# Patient Record
Sex: Female | Born: 1935 | Race: Black or African American | Hispanic: No | Marital: Single | Smoking: Former smoker
Health system: Southern US, Community
[De-identification: ages and names within clinical notes are randomized; demographics above are authoritative.]

## PROBLEM LIST (undated history)

## (undated) DIAGNOSIS — I10 Essential (primary) hypertension: Secondary | ICD-10-CM

## (undated) DIAGNOSIS — E785 Hyperlipidemia, unspecified: Secondary | ICD-10-CM

## (undated) DIAGNOSIS — B029 Zoster without complications: Secondary | ICD-10-CM

## (undated) DIAGNOSIS — R011 Cardiac murmur, unspecified: Secondary | ICD-10-CM

## (undated) HISTORY — DX: Hyperlipidemia, unspecified: E78.5

## (undated) HISTORY — DX: Essential (primary) hypertension: I10

## (undated) HISTORY — PX: BREAST BIOPSY: SHX20

## (undated) HISTORY — PX: TONSILLECTOMY: SUR1361

---

## 1978-03-07 HISTORY — PX: ABDOMINAL HYSTERECTOMY: SHX81

## 2007-08-03 ENCOUNTER — Ambulatory Visit: Payer: Self-pay | Admitting: Internal Medicine

## 2009-01-22 ENCOUNTER — Ambulatory Visit: Payer: Self-pay | Admitting: Family Medicine

## 2010-02-15 ENCOUNTER — Ambulatory Visit: Payer: Self-pay | Admitting: Family Medicine

## 2011-03-16 ENCOUNTER — Ambulatory Visit: Payer: Self-pay | Admitting: Family Medicine

## 2012-04-11 ENCOUNTER — Ambulatory Visit: Payer: Self-pay | Admitting: Internal Medicine

## 2012-06-27 ENCOUNTER — Ambulatory Visit: Payer: Self-pay | Admitting: Internal Medicine

## 2013-05-01 ENCOUNTER — Ambulatory Visit: Payer: Self-pay | Admitting: Internal Medicine

## 2014-05-07 ENCOUNTER — Ambulatory Visit: Payer: Self-pay | Admitting: Internal Medicine

## 2014-05-07 DIAGNOSIS — R928 Other abnormal and inconclusive findings on diagnostic imaging of breast: Secondary | ICD-10-CM | POA: Diagnosis not present

## 2014-05-07 DIAGNOSIS — Z1231 Encounter for screening mammogram for malignant neoplasm of breast: Secondary | ICD-10-CM | POA: Diagnosis not present

## 2014-05-13 ENCOUNTER — Ambulatory Visit: Payer: Self-pay | Admitting: Internal Medicine

## 2014-05-13 DIAGNOSIS — N6489 Other specified disorders of breast: Secondary | ICD-10-CM | POA: Diagnosis not present

## 2014-05-13 DIAGNOSIS — R928 Other abnormal and inconclusive findings on diagnostic imaging of breast: Secondary | ICD-10-CM | POA: Diagnosis not present

## 2014-05-13 LAB — HM MAMMOGRAPHY: HM Mammogram: NORMAL

## 2014-07-17 DIAGNOSIS — Z23 Encounter for immunization: Secondary | ICD-10-CM | POA: Diagnosis not present

## 2014-07-17 DIAGNOSIS — Z0000 Encounter for general adult medical examination without abnormal findings: Secondary | ICD-10-CM | POA: Diagnosis not present

## 2014-07-17 DIAGNOSIS — E782 Mixed hyperlipidemia: Secondary | ICD-10-CM | POA: Diagnosis not present

## 2014-07-17 DIAGNOSIS — I10 Essential (primary) hypertension: Secondary | ICD-10-CM | POA: Diagnosis not present

## 2014-07-17 LAB — LIPID PANEL
Cholesterol: 156 mg/dL (ref 0–200)
HDL: 66 mg/dL (ref 35–70)
LDL Cholesterol: 72 mg/dL
Triglycerides: 89 mg/dL (ref 40–160)

## 2014-07-17 LAB — HEMOGLOBIN A1C: HEMOGLOBIN A1C: 6.7 % — AB (ref 4.0–6.0)

## 2014-08-29 ENCOUNTER — Encounter: Payer: Self-pay | Admitting: Internal Medicine

## 2014-08-29 ENCOUNTER — Other Ambulatory Visit: Payer: Self-pay | Admitting: Internal Medicine

## 2014-08-29 ENCOUNTER — Other Ambulatory Visit: Payer: Self-pay

## 2014-08-29 DIAGNOSIS — E119 Type 2 diabetes mellitus without complications: Secondary | ICD-10-CM | POA: Insufficient documentation

## 2014-08-29 DIAGNOSIS — I10 Essential (primary) hypertension: Secondary | ICD-10-CM | POA: Insufficient documentation

## 2014-08-29 DIAGNOSIS — E782 Mixed hyperlipidemia: Secondary | ICD-10-CM | POA: Insufficient documentation

## 2014-08-29 DIAGNOSIS — M179 Osteoarthritis of knee, unspecified: Secondary | ICD-10-CM | POA: Insufficient documentation

## 2014-08-29 DIAGNOSIS — M1710 Unilateral primary osteoarthritis, unspecified knee: Secondary | ICD-10-CM | POA: Insufficient documentation

## 2014-08-29 MED ORDER — RAMIPRIL 2.5 MG PO CAPS: 2.5000 mg | ORAL_CAPSULE | Freq: Every day | ORAL | Status: DC

## 2014-11-28 ENCOUNTER — Other Ambulatory Visit: Payer: Self-pay | Admitting: Internal Medicine

## 2014-12-12 ENCOUNTER — Other Ambulatory Visit: Payer: Self-pay

## 2014-12-12 MED ORDER — ACCU-CHEK MULTICLIX LANCETS MISC: Status: DC

## 2014-12-12 MED ORDER — GLUCOSE BLOOD VI STRP: 1.0000 | ORAL_STRIP | Freq: Every day | Status: AC

## 2014-12-16 ENCOUNTER — Other Ambulatory Visit: Payer: Self-pay | Admitting: Internal Medicine

## 2014-12-16 MED ORDER — ACCU-CHEK SOFTCLIX LANCETS MISC: Status: AC

## 2015-04-01 ENCOUNTER — Other Ambulatory Visit: Payer: Self-pay

## 2015-04-01 MED ORDER — RAMIPRIL 2.5 MG PO CAPS: 2.5000 mg | ORAL_CAPSULE | Freq: Every day | ORAL | Status: DC

## 2015-04-01 MED ORDER — SIMVASTATIN 20 MG PO TABS: 20.0000 mg | ORAL_TABLET | Freq: Every day | ORAL | Status: DC

## 2015-04-01 MED ORDER — HYDROCHLOROTHIAZIDE 25 MG PO TABS: 25.0000 mg | ORAL_TABLET | Freq: Every day | ORAL | Status: DC

## 2015-04-01 MED ORDER — AMLODIPINE BESYLATE 5 MG PO TABS: 5.0000 mg | ORAL_TABLET | Freq: Every day | ORAL | Status: DC

## 2015-06-26 ENCOUNTER — Other Ambulatory Visit: Payer: Self-pay | Admitting: Internal Medicine

## 2015-06-28 ENCOUNTER — Other Ambulatory Visit: Payer: Self-pay | Admitting: Internal Medicine

## 2015-06-29 ENCOUNTER — Ambulatory Visit: Payer: Self-pay | Admitting: Internal Medicine

## 2015-07-06 DIAGNOSIS — B029 Zoster without complications: Secondary | ICD-10-CM

## 2015-07-06 HISTORY — DX: Zoster without complications: B02.9

## 2015-07-10 ENCOUNTER — Encounter: Payer: Self-pay | Admitting: Internal Medicine

## 2015-07-10 ENCOUNTER — Ambulatory Visit (INDEPENDENT_AMBULATORY_CARE_PROVIDER_SITE_OTHER): Payer: Medicare Other | Admitting: Internal Medicine

## 2015-07-10 VITALS — BP 142/60 | HR 60 | Temp 98.1°F | Resp 16 | Ht 71.0 in | Wt 141.5 lb

## 2015-07-10 DIAGNOSIS — J3089 Other allergic rhinitis: Secondary | ICD-10-CM | POA: Insufficient documentation

## 2015-07-10 DIAGNOSIS — E119 Type 2 diabetes mellitus without complications: Secondary | ICD-10-CM | POA: Diagnosis not present

## 2015-07-10 DIAGNOSIS — I10 Essential (primary) hypertension: Secondary | ICD-10-CM

## 2015-07-10 DIAGNOSIS — M62830 Muscle spasm of back: Secondary | ICD-10-CM

## 2015-07-10 DIAGNOSIS — H6120 Impacted cerumen, unspecified ear: Secondary | ICD-10-CM | POA: Insufficient documentation

## 2015-07-10 DIAGNOSIS — H6123 Impacted cerumen, bilateral: Secondary | ICD-10-CM | POA: Diagnosis not present

## 2015-07-10 MED ORDER — AMLODIPINE BESYLATE 5 MG PO TABS: 5.0000 mg | ORAL_TABLET | Freq: Every day | ORAL | Status: DC

## 2015-07-10 MED ORDER — SIMVASTATIN 20 MG PO TABS: 20.0000 mg | ORAL_TABLET | Freq: Every day | ORAL | Status: DC

## 2015-07-10 MED ORDER — RAMIPRIL 2.5 MG PO CAPS: 2.5000 mg | ORAL_CAPSULE | Freq: Every day | ORAL | Status: DC

## 2015-07-10 MED ORDER — HYDROCHLOROTHIAZIDE 25 MG PO TABS: 25.0000 mg | ORAL_TABLET | Freq: Every day | ORAL | Status: DC

## 2015-07-10 NOTE — Patient Instructions (Signed)
[facility] for eye care/cataracts  Claritin (loratidine) is good for allergies and sinus congestion  Heat to neck, shoulders and upper back 2-3 times per day for 20 minutes to reduce spasm  Take Tylenol 650 mg 2-3 times per day

## 2015-07-10 NOTE — Progress Notes (Signed)
Date:  07/10/2015   Name:  Ashlee Grimes   DOB:  01-25-1931   MRN:  [id number]   Chief Complaint: Headache; Otalgia; and Sinus Problem Sinus Problem This is a new problem. The current episode started in the past 7 days. There has been no fever. Associated symptoms include congestion, ear pain and headaches. Pertinent negatives include no chills, coughing, shortness of breath, sinus pressure, sneezing, sore throat or swollen glands. (Thick clear sinus mucus) Past treatments include nothing.   Neck stiffness - she has stiffness of the posterior neck and shoulders.  She also has discomfort at the base of the skull radiating up over the vertex.  She has not taken any medication or use heat/ice.  Ear discomfort - on the right - vague fullness.  No drainage, pain or fever.  Lab Results  Component Value Date   HGBA1C 6.7* 07/17/2014    Review of Systems  Constitutional: Negative for fever, chills, fatigue and unexpected weight change.  HENT: Positive for congestion and ear pain. Negative for ear discharge, rhinorrhea, sinus pressure, sneezing, sore throat and tinnitus.   Eyes: Negative for visual disturbance.  Respiratory: Negative for cough, choking, chest tightness and shortness of breath.   Cardiovascular: Negative for chest pain and palpitations.  Neurological: Positive for headaches. Negative for tremors, seizures and weakness.  Hematological: Negative for adenopathy.  Psychiatric/Behavioral: Positive for confusion. Negative for decreased concentration.    Patient Active Problem List   Diagnosis Date Noted  . Type 2 diabetes mellitus without complication, without long-term current use of insulin (HCC) 07/10/2015  . Essential (primary) hypertension 08/29/2014  . Combined fat and carbohydrate induced hyperlipemia 08/29/2014  . Arthritis of knee, degenerative 08/29/2014    Prior to Admission medications   Medication Sig Start Date End Date Taking? Authorizing Provider    ACCU-CHEK SOFTCLIX LANCETS lancets Use as instructed 12/16/14  Yes Reubin MilanLaura H Montez Stryker, MD  amLODipine (NORVASC) 5 MG tablet TAKE 1 TABLET (5 MG TOTAL) BY MOUTH DAILY. 06/26/15  Yes Reubin MilanLaura H Caroly Purewal, MD  aspirin 81 MG tablet Take 1 tablet by mouth daily.   Yes Historical Provider, MD  glucose blood (ACCU-CHEK AVIVA PLUS) test strip 1 each by Other route daily. 12/12/14  Yes Reubin MilanLaura H Madine Sarr, MD  hydrochlorothiazide (HYDRODIURIL) 25 MG tablet TAKE 1 TABLET (25 MG TOTAL) BY MOUTH DAILY. 06/26/15  Yes Reubin MilanLaura H Christena Sunderlin, MD  ramipril (ALTACE) 2.5 MG capsule TAKE 1 CAPSULE (2.5 MG TOTAL) BY MOUTH DAILY. 06/28/15  Yes Reubin MilanLaura H Destane Speas, MD  simvastatin (ZOCOR) 20 MG tablet TAKE 1 TABLET (20 MG TOTAL) BY MOUTH AT BEDTIME. 06/26/15   Reubin MilanLaura H Royetta Probus, MD    No Known Allergies  Past Surgical History  Procedure Laterality Date  . Abdominal hysterectomy  1980  . Tonsillectomy    . Breast biopsy      Social History  Substance Use Topics  . Smoking status: Former Games developermoker  . Smokeless tobacco: None  . Alcohol Use: No    Medication list has been reviewed and updated.   Physical Exam  Constitutional: She is oriented to person, place, and time. She appears well-developed. No distress.  HENT:  Head: Normocephalic and atraumatic.  Nose: Right sinus exhibits no maxillary sinus tenderness and no frontal sinus tenderness. Left sinus exhibits no maxillary sinus tenderness and no frontal sinus tenderness.  Mouth/Throat: No posterior oropharyngeal edema or posterior oropharyngeal erythema.  Both ear canals with excessive cerumen  Eyes: Pupils are equal, round, and reactive to light.  Neck: Spinous process tenderness and muscular tenderness present. Decreased range of motion present.  Cardiovascular: Normal rate, regular rhythm and normal heart sounds.   Pulmonary/Chest: Effort normal and breath sounds normal. No respiratory distress. She has no decreased breath sounds. She has no wheezes.  Musculoskeletal:   Muscle spasm and mild tenderness of both trapezius muscles and posterior neck. No TMJ pain or click. No temporal artery cord or tenderness  Neurological: She is alert and oriented to person, place, and time.  Skin: Skin is warm and dry. No rash noted.  Psychiatric: She has a normal mood and affect. Her behavior is normal. Thought content normal.  Nursing note and vitals reviewed.   BP 142/60 mmHg  Pulse 60  Temp(Src) 98.1 F (36.7 C)  Resp 16  Ht  (1.803 m)  Wt 141 lb 8 oz (64.184 kg)  BMI 19.74 kg/m2  SpO2 100%  Assessment and Plan: 1. Muscle spasm of back Use heat 3 times per day May use topical arthritis rubs as needed Tylenol 650 mg 2-3 times per day  2. Essential (primary) hypertension controlled - Comprehensive metabolic panel - ramipril (ALTACE) 2.5 MG capsule; Take 1 capsule (2.5 mg total) by mouth daily.  Dispense: 90 capsule; Refill: 1 - hydrochlorothiazide (HYDRODIURIL) 25 MG tablet; Take 1 tablet (25 mg total) by mouth daily.  Dispense: 90 tablet; Refill: 1 - amLODipine (NORVASC) 5 MG tablet; Take 1 tablet (5 mg total) by mouth daily.  Dispense: 90 tablet; Refill: 1  3. Type 2 diabetes mellitus without complication, without long-term current use of insulin (HCC) Doing well on diet alone - Hemoglobin A1c  4. Environmental and seasonal allergies Try allegra or claritin  ENT for ear evaluation if unable to remove cerumen at home   Bari Edward, MD [facility]  07/10/2015

## 2015-07-11 LAB — HEMOGLOBIN A1C
ESTIMATED AVERAGE GLUCOSE: 143 mg/dL
HEMOGLOBIN A1C: 6.6 % — AB (ref 4.8–5.6)

## 2015-07-11 LAB — COMPREHENSIVE METABOLIC PANEL
ALK PHOS: 55 IU/L (ref 39–117)
ALT: 11 IU/L (ref 0–32)
AST: 17 IU/L (ref 0–40)
Albumin/Globulin Ratio: 1.5 (ref 1.2–2.2)
Albumin: 4.3 g/dL (ref 3.5–4.7)
BILIRUBIN TOTAL: 0.3 mg/dL (ref 0.0–1.2)
BUN/Creatinine Ratio: 17 (ref 12–28)
BUN: 14 mg/dL (ref 8–27)
CHLORIDE: 95 mmol/L — AB (ref 96–106)
CO2: 26 mmol/L (ref 18–29)
Calcium: 9.3 mg/dL (ref 8.7–10.3)
Creatinine, Ser: 0.84 mg/dL (ref 0.57–1.00)
GFR calc non Af Amer: 64 mL/min/{1.73_m2} (ref >59)
GFR, EST AFRICAN AMERICAN: 74 mL/min/{1.73_m2} (ref >59)
GLUCOSE: 103 mg/dL — AB (ref 65–99)
Globulin, Total: 2.9 g/dL (ref 1.5–4.5)
Potassium: 3.9 mmol/L (ref 3.5–5.2)
Sodium: 141 mmol/L (ref 134–144)
TOTAL PROTEIN: 7.2 g/dL (ref 6.0–8.5)

## 2015-08-05 ENCOUNTER — Ambulatory Visit (INDEPENDENT_AMBULATORY_CARE_PROVIDER_SITE_OTHER): Payer: Medicare Other | Admitting: Internal Medicine

## 2015-08-05 ENCOUNTER — Encounter: Payer: Self-pay | Admitting: Internal Medicine

## 2015-08-05 VITALS — BP 116/60 | HR 74 | Temp 98.4°F | Resp 16 | Ht 71.0 in | Wt 139.0 lb

## 2015-08-05 DIAGNOSIS — R21 Rash and other nonspecific skin eruption: Secondary | ICD-10-CM

## 2015-08-05 DIAGNOSIS — B029 Zoster without complications: Secondary | ICD-10-CM | POA: Diagnosis not present

## 2015-08-05 MED ORDER — VALACYCLOVIR HCL 1 G PO TABS: 1000.0000 mg | ORAL_TABLET | Freq: Two times a day (BID) | ORAL | Status: DC

## 2015-08-05 MED ORDER — METHYLPREDNISOLONE 4 MG PO TBPK: ORAL_TABLET | ORAL | Status: DC

## 2015-08-05 NOTE — Patient Instructions (Signed)
For itching - Benedryl 25 mg at night           Allegra 180 mg or Claritin 10 mg (get generic store brand) once a day                      Ranitidine 150 mg twice a day

## 2015-08-05 NOTE — Progress Notes (Signed)
Date:  08/05/2015   Name:  Ashlee Grimes   DOB:  September 05, 1930   MRN:  [id number]   Chief Complaint: Rash Rash This is a new problem. The current episode started in the past 7 days. The problem has been gradually improving (but new lesions are appearing) since onset. The affected locations include the right arm, neck and head. The rash is characterized by blistering, burning and itchiness. Associated with: pulled weeds outside but did not see poison ivy. Pertinent negatives include no eye pain, fatigue, fever or shortness of breath. Past treatments include anti-itch cream. The treatment provided mild relief.      Review of Systems  Constitutional: Negative for fever and fatigue.  Eyes: Negative for pain.  Respiratory: Negative for chest tightness, shortness of breath and wheezing.   Cardiovascular: Negative for chest pain, palpitations and leg swelling.  Skin: Positive for rash.    Patient Active Problem List   Diagnosis Date Noted  . Type 2 diabetes mellitus without complication, without long-term current use of insulin (HCC) 07/10/2015  . Muscle spasm of back 07/10/2015  . Environmental and seasonal allergies 07/10/2015  . Cerumen impaction 07/10/2015  . Essential (primary) hypertension 08/29/2014  . Combined fat and carbohydrate induced hyperlipemia 08/29/2014  . Arthritis of knee, degenerative 08/29/2014    Prior to Admission medications   Medication Sig Start Date End Date Taking? Authorizing Provider  ACCU-CHEK SOFTCLIX LANCETS lancets Use as instructed 12/16/14  Yes Reubin Milan, MD  amLODipine (NORVASC) 5 MG tablet Take 1 tablet (5 mg total) by mouth daily. 07/10/15  Yes Reubin Milan, MD  aspirin 81 MG tablet Take 1 tablet by mouth daily.   Yes Historical Provider, MD  diphenhydrAMINE (BENADRYL) 25 MG tablet Take 25 mg by mouth every 6 (six) hours as needed.   Yes Historical Provider, MD  glucose blood (ACCU-CHEK AVIVA PLUS) test strip 1 each by Other route  daily. 12/12/14  Yes Reubin Milan, MD  hydrochlorothiazide (HYDRODIURIL) 25 MG tablet Take 1 tablet (25 mg total) by mouth daily. 07/10/15  Yes Reubin Milan, MD  hydrocortisone cream 0.5 % Apply 1 application topically 2 (two) times daily.   Yes Historical Provider, MD  ramipril (ALTACE) 2.5 MG capsule Take 1 capsule (2.5 mg total) by mouth daily. 07/10/15  Yes Reubin Milan, MD  simvastatin (ZOCOR) 20 MG tablet Take 1 tablet (20 mg total) by mouth daily at 6 PM. 07/10/15  Yes Reubin Milan, MD    No Known Allergies  Past Surgical History  Procedure Laterality Date  . Abdominal hysterectomy  1980  . Tonsillectomy    . Breast biopsy      Social History  Substance Use Topics  . Smoking status: Former Games developer  . Smokeless tobacco: None  . Alcohol Use: No     Medication list has been reviewed and updated.   Physical Exam  Constitutional: She is oriented to person, place, and time. She appears well-developed and well-nourished. No distress.  HENT:  Head: Normocephalic and atraumatic.  Neck: Normal range of motion. Neck supple.  Cardiovascular: Normal rate, regular rhythm and normal heart sounds.   Pulmonary/Chest: Effort normal and breath sounds normal. No respiratory distress. She has no wheezes.  Lymphadenopathy:    She has no cervical adenopathy.  Neurological: She is alert and oriented to person, place, and time.  Skin: Skin is warm and dry.     Drying vesicles on a red base in clusters as marked.  Psychiatric: She has a normal mood and affect. Her behavior is normal. Thought content normal.    BP 116/60 mmHg  Pulse 74  Temp(Src) 98.4 F (36.9 C) (Oral)  Resp 16  Ht 5\' 11"  (1.803 m)  Wt 139 lb (63.05 kg)  BMI 19.40 kg/m2  SpO2 99%  Assessment and Plan: 1. Herpes zoster Suspect Zoster even though a few lesions are on the left Pt cautioned regarding signs of eye involvement - valACYclovir (VALTREX) 1000 MG tablet; Take 1 tablet (1,000 mg total) by mouth 2  (two) times daily.  Dispense: 30 tablet; Refill: 0  2. Rash Will treat pruritis with prednisone taper and otc zantac,claritin and benedry. - methylPREDNISolone (MEDROL DOSEPAK) 4 MG TBPK tablet; Take 6 pills on day 1 the 5 pills day 2 then 4 pills day 3 then 3 pills day 4 then 2 pills day 5 then one pills day 6 then stop  Dispense: 21 tablet; Refill: 0   Bari EdwardLaura Kristle Wesch, MD [facility]  08/05/2015

## 2015-09-23 DIAGNOSIS — H40003 Preglaucoma, unspecified, bilateral: Secondary | ICD-10-CM | POA: Diagnosis not present

## 2015-10-06 DIAGNOSIS — H2513 Age-related nuclear cataract, bilateral: Secondary | ICD-10-CM | POA: Diagnosis not present

## 2015-10-07 ENCOUNTER — Encounter: Payer: Self-pay | Admitting: *Deleted

## 2015-10-09 NOTE — Discharge Instructions (Signed)

## 2015-10-12 ENCOUNTER — Encounter
Admission: RE | Disposition: A | Discharge status: Home / Self Care | Payer: Self-pay | Source: Ambulatory Visit | Attending: Ophthalmology

## 2015-10-12 ENCOUNTER — Ambulatory Visit: Payer: Medicare Other | Admitting: Anesthesiology

## 2015-10-12 ENCOUNTER — Ambulatory Visit
Admission: RE | Admit: 2015-10-12 | Discharge: 2015-10-12 | Disposition: A | Discharge status: Home / Self Care | Payer: Medicare Other | Source: Ambulatory Visit | Attending: Ophthalmology | Admitting: Ophthalmology

## 2015-10-12 DIAGNOSIS — Z79899 Other long term (current) drug therapy: Secondary | ICD-10-CM | POA: Diagnosis not present

## 2015-10-12 DIAGNOSIS — Z7982 Long term (current) use of aspirin: Secondary | ICD-10-CM | POA: Insufficient documentation

## 2015-10-12 DIAGNOSIS — I10 Essential (primary) hypertension: Secondary | ICD-10-CM | POA: Diagnosis not present

## 2015-10-12 DIAGNOSIS — Z90710 Acquired absence of both cervix and uterus: Secondary | ICD-10-CM | POA: Diagnosis not present

## 2015-10-12 DIAGNOSIS — H2511 Age-related nuclear cataract, right eye: Secondary | ICD-10-CM | POA: Diagnosis not present

## 2015-10-12 DIAGNOSIS — E1136 Type 2 diabetes mellitus with diabetic cataract: Secondary | ICD-10-CM | POA: Diagnosis not present

## 2015-10-12 DIAGNOSIS — H9190 Unspecified hearing loss, unspecified ear: Secondary | ICD-10-CM | POA: Diagnosis not present

## 2015-10-12 DIAGNOSIS — H2513 Age-related nuclear cataract, bilateral: Secondary | ICD-10-CM | POA: Diagnosis not present

## 2015-10-12 DIAGNOSIS — F17200 Nicotine dependence, unspecified, uncomplicated: Secondary | ICD-10-CM | POA: Insufficient documentation

## 2015-10-12 DIAGNOSIS — R011 Cardiac murmur, unspecified: Secondary | ICD-10-CM | POA: Diagnosis not present

## 2015-10-12 DIAGNOSIS — E7800 Pure hypercholesterolemia, unspecified: Secondary | ICD-10-CM | POA: Insufficient documentation

## 2015-10-12 HISTORY — DX: Zoster without complications: B02.9

## 2015-10-12 HISTORY — PX: CATARACT EXTRACTION W/PHACO: SHX586

## 2015-10-12 HISTORY — DX: Cardiac murmur, unspecified: R01.1

## 2015-10-12 LAB — GLUCOSE, CAPILLARY: Glucose-Capillary: 109 mg/dL — ABNORMAL HIGH (ref 65–99)

## 2015-10-12 SURGERY — PHACOEMULSIFICATION, CATARACT, WITH IOL INSERTION
Anesthesia: Topical | Laterality: Right | Wound class: Clean

## 2015-10-12 MED ORDER — POVIDONE-IODINE 5 % OP SOLN
1.0000 "application " | OPHTHALMIC | Status: DC | PRN
  Administered 2015-10-12: 1 via OPHTHALMIC

## 2015-10-12 MED ORDER — ACETAMINOPHEN 325 MG PO TABS
325.0000 mg | ORAL_TABLET | ORAL | Status: DC | PRN
Start: 2015-10-12 — End: 2015-10-12

## 2015-10-12 MED ORDER — EPINEPHRINE HCL 1 MG/ML IJ SOLN
INTRAMUSCULAR | Status: DC | PRN
  Administered 2015-10-12: 80 mL via OPHTHALMIC

## 2015-10-12 MED ORDER — TETRACAINE HCL 0.5 % OP SOLN
1.0000 [drp] | OPHTHALMIC | Status: DC | PRN
  Administered 2015-10-12: 1 [drp] via OPHTHALMIC

## 2015-10-12 MED ORDER — BACITRACIN 500 UNIT/GM OP OINT: TOPICAL_OINTMENT | OPHTHALMIC | Status: DC | PRN

## 2015-10-12 MED ORDER — NA HYALUR & NA CHOND-NA HYALUR 0.4-0.35 ML IO KIT
PACK | INTRAOCULAR | Status: DC | PRN
  Administered 2015-10-12: 1 mL via INTRAOCULAR

## 2015-10-12 MED ORDER — ARMC OPHTHALMIC DILATING GEL
1.0000 "application " | OPHTHALMIC | Status: DC | PRN
  Administered 2015-10-12 (×2): 1 via OPHTHALMIC

## 2015-10-12 MED ORDER — TIMOLOL MALEATE 0.5 % OP SOLN
OPHTHALMIC | Status: DC | PRN
  Administered 2015-10-12: 1 [drp] via OPHTHALMIC

## 2015-10-12 MED ORDER — ACETAMINOPHEN 160 MG/5ML PO SOLN
325.0000 mg | ORAL | Status: DC | PRN
Start: 2015-10-12 — End: 2015-10-12

## 2015-10-12 MED ORDER — CEFUROXIME OPHTHALMIC INJECTION 1 MG/0.1 ML
INJECTION | OPHTHALMIC | Status: DC | PRN
  Administered 2015-10-12: 0.1 mL via INTRACAMERAL

## 2015-10-12 MED ORDER — FENTANYL CITRATE (PF) 100 MCG/2ML IJ SOLN
INTRAMUSCULAR | Status: DC | PRN
  Administered 2015-10-12: 50 ug via INTRAVENOUS

## 2015-10-12 MED ORDER — MIDAZOLAM HCL 2 MG/2ML IJ SOLN
INTRAMUSCULAR | Status: DC | PRN
  Administered 2015-10-12: 2 mg via INTRAVENOUS

## 2015-10-12 MED ORDER — LACTATED RINGERS IV SOLN: INTRAVENOUS | Status: DC

## 2015-10-12 MED ORDER — BRIMONIDINE TARTRATE 0.2 % OP SOLN
OPHTHALMIC | Status: DC | PRN
  Administered 2015-10-12: 1 [drp] via OPHTHALMIC

## 2015-10-12 SURGICAL SUPPLY — 29 items
APPLICATOR COTTON TIP 3IN (MISCELLANEOUS) ×3 IMPLANT
CANNULA ANT/CHMB 27GA (MISCELLANEOUS) ×3 IMPLANT
DISSECTOR HYDRO NUCLEUS 50X22 (MISCELLANEOUS) ×3 IMPLANT
GLOVE BIO SURGEON STRL SZ7 (GLOVE) ×3 IMPLANT
GLOVE SURG LX 6.5 MICRO (GLOVE) ×2
GLOVE SURG LX STRL 6.5 MICRO (GLOVE) ×1 IMPLANT
GOWN STRL REUS W/ TWL LRG LVL3 (GOWN DISPOSABLE) ×2 IMPLANT
GOWN STRL REUS W/TWL LRG LVL3 (GOWN DISPOSABLE) ×4
LENS IOL ACRSF IQ ULTRA 16.0 (Intraocular Lens) ×1 IMPLANT
LENS IOL ACRYSOF IQ 16.0 (Intraocular Lens) ×3 IMPLANT
MARKER SKIN DUAL TIP RULER LAB (MISCELLANEOUS) ×3 IMPLANT
NEEDLE FILTER BLUNT 18X 1/2SAF (NEEDLE) ×2
NEEDLE FILTER BLUNT 18X1 1/2 (NEEDLE) ×1 IMPLANT
PACK CATARACT BRASINGTON (MISCELLANEOUS) ×3 IMPLANT
PACK EYE AFTER SURG (MISCELLANEOUS) ×3 IMPLANT
PACK OPTHALMIC (MISCELLANEOUS) ×3 IMPLANT
RING MALYGIN 7.0 (MISCELLANEOUS) IMPLANT
SOL BAL SALT 15ML (MISCELLANEOUS)
SOLUTION BAL SALT 15ML (MISCELLANEOUS) IMPLANT
SUT ETHILON 10-0 CS-B-6CS-B-6 (SUTURE)
SUT VICRYL  9 0 (SUTURE)
SUT VICRYL 9 0 (SUTURE) IMPLANT
SUTURE EHLN 10-0 CS-B-6CS-B-6 (SUTURE) IMPLANT
SYR 3ML LL SCALE MARK (SYRINGE) ×3 IMPLANT
SYR TB 1ML LUER SLIP (SYRINGE) ×3 IMPLANT
WATER STERILE IRR 250ML POUR (IV SOLUTION) ×3 IMPLANT
WATER STERILE IRR 500ML POUR (IV SOLUTION) IMPLANT
WICK EYE OCUCEL (MISCELLANEOUS) IMPLANT
WIPE NON LINTING 3.25X3.25 (MISCELLANEOUS) ×3 IMPLANT

## 2015-10-12 NOTE — Anesthesia Postprocedure Evaluation (Signed)
Anesthesia Post Note  Patient: Ashlee Grimes  Procedure(s) Performed: Procedure(s) (LRB): CATARACT EXTRACTION PHACO AND INTRAOCULAR LENS PLACEMENT (IOC) (Right)  Patient location during evaluation: PACU Anesthesia Type: MAC Level of consciousness: awake and alert and oriented Pain management: satisfactory to patient Vital Signs Assessment: post-procedure vital signs reviewed and stable Respiratory status: spontaneous breathing, nonlabored ventilation and respiratory function stable Cardiovascular status: blood pressure returned to baseline and stable Postop Assessment: Adequate PO intake and No signs of nausea or vomiting Anesthetic complications: no    Cherly BeachStella, Al Bracewell J

## 2015-10-12 NOTE — Anesthesia Preprocedure Evaluation (Signed)
Anesthesia Evaluation  Patient identified by MRN, date of birth, ID band Patient awake    Reviewed: Allergy & Precautions, H&P , NPO status , Patient's Chart, lab work & pertinent test results  Airway Mallampati: II  TM Distance: >3 FB Neck ROM: full    Dental  (+) Poor Dentition, Missing   Pulmonary former smoker,    Pulmonary exam normal        Cardiovascular hypertension, On Medications Normal cardiovascular exam     Neuro/Psych    GI/Hepatic   Endo/Other  diabetes  Renal/GU      Musculoskeletal   Abdominal   Peds  Hematology   Anesthesia Other Findings   Reproductive/Obstetrics                             Anesthesia Physical Anesthesia Plan  ASA: II  Anesthesia Plan:    Post-op Pain Management:    Induction: Intravenous  Airway Management Planned: Nasal Cannula  Additional Equipment:   Intra-op Plan:   Post-operative Plan:   Informed Consent: I have reviewed the patients History and Physical, chart, labs and discussed the procedure including the risks, benefits and alternatives for the proposed anesthesia with the patient or authorized representative who has indicated his/her understanding and acceptance.     Plan Discussed with:   Anesthesia Plan Comments:         Anesthesia Quick Evaluation

## 2015-10-12 NOTE — H&P (Signed)
H+P reviewed and is up to date, please see paper chart.  

## 2015-10-12 NOTE — Op Note (Signed)
Date of Surgery: 10/12/2015  PREOPERATIVE DIAGNOSES: Visually significant nuclear sclerotic cataract, right eye.  POSTOPERATIVE DIAGNOSES: Same  PROCEDURES PERFORMED: Cataract extraction with intraocular lens implant, right eye.  SURGEON: Devin GoingAnita P. Vin, M.D.  ANESTHESIA: MAC and topical  IMPLANTS: [id number] +16.0 D  Implant Name Type Inv. Item Serial No. Manufacturer Lot No. LRB No. Used  LENS IOL ACRYSOF IQ 16.0 - [id number] Intraocular Lens LENS IOL ACRYSOF IQ 16.0 [id number] ALCON   Right 1     COMPLICATIONS: None.  DESCRIPTION OF PROCEDURE: Therapeutic options were discussed with the patient preoperatively, including a discussion of risks and benefits of surgery. Informed consent was obtained. An IOL-Master and immersion biometry were used to take the lens measurements, and a dilated fundus exam was performed within 6 months of the surgical date.  The patient was premedicated and brought to the operating room and placed on the operating table in the supine position. After adequate anesthesia, the patient was prepped and draped in the usual sterile ophthalmic fashion. A wire lid speculum was inserted and the microscope was positioned. A Superblade was used to create a paracentesis site at the limbus and a small amount of dilute preservative free lidocaine was instilled into the anterior chamber, followed by dispersive viscoelastic. A clear corneal incision was created temporally using a 2.4 mm keratome blade. Capsulorrhexis was then performed. In situ phacoemulsification was performed.  Cortical material was removed with the irrigation-aspiration unit. Dispersive viscoelastic was instilled to open the capsular bag. A posterior chamber intraocular lens with the specifications above was inserted and positioned. Irrigation-aspiration was used to remove all viscoelastic. Cefuroxime 1cc was instilled into the anterior chamber, and the corneal incision was checked and found to be water tight. The  eyelid speculum was removed.  The operative eye was covered with protective goggles after instilling 1 drop of timolol and brimonidine. The patient tolerated the procedure well. There were no complications.

## 2015-10-12 NOTE — Transfer of Care (Signed)
Immediate Anesthesia Transfer of Care Note  [name]  Procedure(s) Performed: Procedure(s) with comments: CATARACT EXTRACTION PHACO AND INTRAOCULAR LENS PLACEMENT (IOC) (Right) - RIGHT  Patient Location: PACU  Anesthesia Type: No value filed.  Level of Consciousness: awake, alert  and patient cooperative  Airway and Oxygen Therapy: Patient Spontanous Breathing and Patient connected to supplemental oxygen  Post-op Assessment: Post-op Vital signs reviewed, Patient's Cardiovascular Status Stable, Respiratory Function Stable, Patent Airway and No signs of Nausea or vomiting  Post-op Vital Signs: Reviewed and stable  Complications: No apparent anesthesia complications

## 2015-10-12 NOTE — Anesthesia Procedure Notes (Signed)
Procedure Name: MAC Performed by: Emory Leaver Pre-anesthesia Checklist: Patient identified, Emergency Drugs available, Suction available, Timeout performed and Patient being monitored Patient Re-evaluated:Patient Re-evaluated prior to inductionOxygen Delivery Method: Nasal cannula Placement Confirmation: positive ETCO2     

## 2015-10-13 ENCOUNTER — Encounter: Payer: Self-pay | Admitting: Ophthalmology

## 2015-11-13 NOTE — Discharge Instructions (Signed)

## 2015-12-07 ENCOUNTER — Encounter: Payer: Self-pay | Admitting: *Deleted

## 2015-12-14 ENCOUNTER — Ambulatory Visit: Payer: Medicare Other | Admitting: Anesthesiology

## 2015-12-14 ENCOUNTER — Encounter
Admission: RE | Disposition: A | Discharge status: Home / Self Care | Payer: Self-pay | Source: Ambulatory Visit | Attending: Ophthalmology

## 2015-12-14 ENCOUNTER — Ambulatory Visit
Admission: RE | Admit: 2015-12-14 | Discharge: 2015-12-14 | Disposition: A | Discharge status: Home / Self Care | Payer: Medicare Other | Source: Ambulatory Visit | Attending: Ophthalmology | Admitting: Ophthalmology

## 2015-12-14 DIAGNOSIS — Z7982 Long term (current) use of aspirin: Secondary | ICD-10-CM | POA: Insufficient documentation

## 2015-12-14 DIAGNOSIS — E7800 Pure hypercholesterolemia, unspecified: Secondary | ICD-10-CM | POA: Diagnosis not present

## 2015-12-14 DIAGNOSIS — I10 Essential (primary) hypertension: Secondary | ICD-10-CM | POA: Insufficient documentation

## 2015-12-14 DIAGNOSIS — Z87891 Personal history of nicotine dependence: Secondary | ICD-10-CM | POA: Diagnosis not present

## 2015-12-14 DIAGNOSIS — E119 Type 2 diabetes mellitus without complications: Secondary | ICD-10-CM | POA: Insufficient documentation

## 2015-12-14 DIAGNOSIS — Z79899 Other long term (current) drug therapy: Secondary | ICD-10-CM | POA: Insufficient documentation

## 2015-12-14 DIAGNOSIS — H2512 Age-related nuclear cataract, left eye: Secondary | ICD-10-CM | POA: Insufficient documentation

## 2015-12-14 DIAGNOSIS — M1990 Unspecified osteoarthritis, unspecified site: Secondary | ICD-10-CM | POA: Diagnosis not present

## 2015-12-14 HISTORY — PX: CATARACT EXTRACTION W/PHACO: SHX586

## 2015-12-14 SURGERY — PHACOEMULSIFICATION, CATARACT, WITH IOL INSERTION
Anesthesia: Monitor Anesthesia Care | Laterality: Left | Wound class: Clean

## 2015-12-14 MED ORDER — MIDAZOLAM HCL 2 MG/2ML IJ SOLN
INTRAMUSCULAR | Status: DC | PRN
  Administered 2015-12-14: 2 mg via INTRAVENOUS

## 2015-12-14 MED ORDER — NA HYALUR & NA CHOND-NA HYALUR 0.4-0.35 ML IO KIT
PACK | INTRAOCULAR | Status: DC | PRN
  Administered 2015-12-14: 1 mL via INTRAOCULAR

## 2015-12-14 MED ORDER — BALANCED SALT IO SOLN
INTRAOCULAR | Status: DC | PRN
  Administered 2015-12-14: 1 mL via OPHTHALMIC

## 2015-12-14 MED ORDER — TIMOLOL MALEATE 0.5 % OP SOLN
OPHTHALMIC | Status: DC | PRN
  Administered 2015-12-14: 3 [drp] via OPHTHALMIC

## 2015-12-14 MED ORDER — BRIMONIDINE TARTRATE 0.2 % OP SOLN
OPHTHALMIC | Status: DC | PRN
  Administered 2015-12-14: 1 [drp] via OPHTHALMIC

## 2015-12-14 MED ORDER — FENTANYL CITRATE (PF) 100 MCG/2ML IJ SOLN
INTRAMUSCULAR | Status: DC | PRN
  Administered 2015-12-14: 50 ug via INTRAVENOUS

## 2015-12-14 MED ORDER — EPINEPHRINE HCL 1 MG/ML IJ SOLN
INTRAOCULAR | Status: DC | PRN
  Administered 2015-12-14: 70 mL via OPHTHALMIC

## 2015-12-14 MED ORDER — ARMC OPHTHALMIC DILATING DROPS
1.0000 "application " | OPHTHALMIC | Status: DC | PRN
  Administered 2015-12-14 (×3): 1 via OPHTHALMIC

## 2015-12-14 MED ORDER — MOXIFLOXACIN HCL 0.5 % OP SOLN
1.0000 [drp] | OPHTHALMIC | Status: DC | PRN
  Administered 2015-12-14 (×3): 1 [drp] via OPHTHALMIC

## 2015-12-14 MED ORDER — CEFUROXIME OPHTHALMIC INJECTION 1 MG/0.1 ML
INJECTION | OPHTHALMIC | Status: DC | PRN
  Administered 2015-12-14: 0.1 mL via INTRACAMERAL

## 2015-12-14 SURGICAL SUPPLY — 29 items
APPLICATOR COTTON TIP 3IN (MISCELLANEOUS) ×3 IMPLANT
CANNULA ANT/CHMB 27GA (MISCELLANEOUS) ×3 IMPLANT
DISSECTOR HYDRO NUCLEUS 50X22 (MISCELLANEOUS) ×3 IMPLANT
GLOVE BIO SURGEON STRL SZ7 (GLOVE) ×3 IMPLANT
GLOVE SURG LX 6.5 MICRO (GLOVE) ×2
GLOVE SURG LX STRL 6.5 MICRO (GLOVE) ×1 IMPLANT
GOWN STRL REUS W/ TWL LRG LVL3 (GOWN DISPOSABLE) ×2 IMPLANT
GOWN STRL REUS W/TWL LRG LVL3 (GOWN DISPOSABLE) ×4
LENS IOL ACRSF IQ ULTRA 16.0 (Intraocular Lens) ×1 IMPLANT
LENS IOL ACRYSOF IQ 16.0 (Intraocular Lens) ×3 IMPLANT
MARKER SKIN DUAL TIP RULER LAB (MISCELLANEOUS) ×3 IMPLANT
NEEDLE FILTER BLUNT 18X 1/2SAF (NEEDLE) ×2
NEEDLE FILTER BLUNT 18X1 1/2 (NEEDLE) ×1 IMPLANT
PACK CATARACT BRASINGTON (MISCELLANEOUS) ×3 IMPLANT
PACK EYE AFTER SURG (MISCELLANEOUS) ×3 IMPLANT
PACK OPTHALMIC (MISCELLANEOUS) ×3 IMPLANT
RING MALYGIN 7.0 (MISCELLANEOUS) IMPLANT
SOL BAL SALT 15ML (MISCELLANEOUS)
SOLUTION BAL SALT 15ML (MISCELLANEOUS) IMPLANT
SUT ETHILON 10-0 CS-B-6CS-B-6 (SUTURE)
SUT VICRYL  9 0 (SUTURE)
SUT VICRYL 9 0 (SUTURE) IMPLANT
SUTURE EHLN 10-0 CS-B-6CS-B-6 (SUTURE) IMPLANT
SYR 3ML LL SCALE MARK (SYRINGE) ×3 IMPLANT
SYR TB 1ML LUER SLIP (SYRINGE) ×3 IMPLANT
WATER STERILE IRR 250ML POUR (IV SOLUTION) ×3 IMPLANT
WATER STERILE IRR 500ML POUR (IV SOLUTION) IMPLANT
WICK EYE OCUCEL (MISCELLANEOUS) IMPLANT
WIPE NON LINTING 3.25X3.25 (MISCELLANEOUS) ×3 IMPLANT

## 2015-12-14 NOTE — Anesthesia Procedure Notes (Signed)
Procedure Name: MAC Performed by: Helios Kohlmann Pre-anesthesia Checklist: Patient identified, Emergency Drugs available, Suction available, Timeout performed and Patient being monitored Patient Re-evaluated:Patient Re-evaluated prior to inductionOxygen Delivery Method: Nasal cannula Placement Confirmation: positive ETCO2     

## 2015-12-14 NOTE — Anesthesia Postprocedure Evaluation (Signed)
Anesthesia Post Note  [facility]  Procedure(s) Performed: Procedure(s) (LRB): CATARACT EXTRACTION PHACO AND INTRAOCULAR LENS PLACEMENT (IOC) (Left)  Patient location during evaluation: PACU Anesthesia Type: MAC Level of consciousness: awake and alert Pain management: pain level controlled Vital Signs Assessment: post-procedure vital signs reviewed and stable Respiratory status: spontaneous breathing, nonlabored ventilation, respiratory function stable and patient connected to nasal cannula oxygen Cardiovascular status: stable and blood pressure returned to baseline Anesthetic complications: no    Thaison Kolodziejski

## 2015-12-14 NOTE — Anesthesia Preprocedure Evaluation (Signed)
Anesthesia Evaluation  Patient identified by MRN, date of birth, ID band Patient awake    Reviewed: Allergy & Precautions, H&P , NPO status , Patient's Chart, lab work & pertinent test results  History of Anesthesia Complications Negative for: history of anesthetic complications  Airway Mallampati: II  TM Distance: >3 FB Neck ROM: full    Dental  (+) Poor Dentition, Missing   Pulmonary neg pulmonary ROS, former smoker,    Pulmonary exam normal        Cardiovascular Exercise Tolerance: Good hypertension, On Medications Normal cardiovascular exam+ Valvular Problems/Murmurs   Lipids    Neuro/Psych negative neurological ROS  negative psych ROS   GI/Hepatic negative GI ROS, Neg liver ROS,   Endo/Other  diabetes (diet)  Renal/GU negative Renal ROS  negative genitourinary   Musculoskeletal  (+) Arthritis ,   Abdominal   Peds  Hematology negative hematology ROS (+)   Anesthesia Other Findings   Reproductive/Obstetrics                             Anesthesia Physical Anesthesia Plan  ASA: II  Anesthesia Plan: MAC   Post-op Pain Management:    Induction:   Airway Management Planned:   Additional Equipment:   Intra-op Plan:   Post-operative Plan:   Informed Consent: I have reviewed the patients History and Physical, chart, labs and discussed the procedure including the risks, benefits and alternatives for the proposed anesthesia with the patient or authorized representative who has indicated his/her understanding and acceptance.     Plan Discussed with: CRNA  Anesthesia Plan Comments:         Anesthesia Quick Evaluation

## 2015-12-14 NOTE — Op Note (Signed)
Date of Surgery: 12/14/2015  PREOPERATIVE DIAGNOSES: Visually significant nuclear sclerotic cataract, left eye.  POSTOPERATIVE DIAGNOSES: Same  PROCEDURES PERFORMED: Cataract extraction with intraocular lens implant, left eye.  SURGEON: Devin GoingAnita P. Vin, M.D.  ANESTHESIA: MAC and topical  IMPLANTS: [id number] +16.0 D  Implant Name Type Inv. Item Serial No. Manufacturer Lot No. LRB No. Used  LENS IOL ACRYSOF IQ 16.0 - [id number] Intraocular Lens LENS IOL ACRYSOF IQ 16.0 [id number] ALCON 16.0 Left 1    COMPLICATIONS: None.  DESCRIPTION OF PROCEDURE: Therapeutic options were discussed with the patient preoperatively, including a discussion of risks and benefits of surgery. Informed consent was obtained. An IOL-Master and immersion biometry were used to take the lens measurements, and a dilated fundus exam was performed within 6 months of the surgical date.  The patient was premedicated and brought to the operating room and placed on the operating table in the supine position. After adequate anesthesia, the patient was prepped and draped in the usual sterile ophthalmic fashion. A wire lid speculum was inserted and the microscope was positioned. A Superblade was used to create a paracentesis site at the limbus and a small amount of dilute preservative free lidocaine was instilled into the anterior chamber, followed by dispersive viscoelastic. A clear corneal incision was created temporally using a 2.4 mm keratome blade. Capsulorrhexis was then performed. In situ phacoemulsification was performed.  Cortical material was removed with the irrigation-aspiration unit. Dispersive viscoelastic was instilled to open the capsular bag. A posterior chamber intraocular lens with the specifications above was inserted and positioned. Irrigation-aspiration was used to remove all viscoelastic. Cefuroxime 1cc was instilled into the anterior chamber, and the corneal incision was checked and found to be water tight. The  eyelid speculum was removed.  The operative eye was covered with protective goggles after instilling 1 drop of timolol and brimonidine. The patient tolerated the procedure well. There were no complications.

## 2015-12-14 NOTE — Transfer of Care (Signed)
Immediate Anesthesia Transfer of Care Note  Patient: Ashlee Grimes  Procedure(s) Performed: Procedure(s) with comments: CATARACT EXTRACTION PHACO AND INTRAOCULAR LENS PLACEMENT (IOC) (Left) - DIABETIC - oral meds LEFT  Patient Location: PACU  Anesthesia Type: MAC  Level of Consciousness: awake, alert  and patient cooperative  Airway and Oxygen Therapy: Patient Spontanous Breathing and Patient connected to supplemental oxygen  Post-op Assessment: Post-op Vital signs reviewed, Patient's Cardiovascular Status Stable, Respiratory Function Stable, Patent Airway and No signs of Nausea or vomiting  Post-op Vital Signs: Reviewed and stable  Complications: No apparent anesthesia complications

## 2015-12-14 NOTE — H&P (Signed)
H+P reviewed and is up to date, please see paper chart.  

## 2015-12-15 ENCOUNTER — Encounter: Payer: Self-pay | Admitting: Ophthalmology

## 2016-01-21 ENCOUNTER — Encounter: Payer: Medicare Other | Admitting: Internal Medicine

## 2016-03-24 ENCOUNTER — Other Ambulatory Visit: Payer: Self-pay | Admitting: Internal Medicine

## 2016-03-24 DIAGNOSIS — I10 Essential (primary) hypertension: Secondary | ICD-10-CM

## 2016-03-25 ENCOUNTER — Other Ambulatory Visit: Payer: Self-pay | Admitting: Internal Medicine

## 2016-03-25 DIAGNOSIS — I10 Essential (primary) hypertension: Secondary | ICD-10-CM

## 2016-06-09 ENCOUNTER — Encounter: Payer: Medicare Other | Admitting: Internal Medicine

## 2016-09-26 ENCOUNTER — Other Ambulatory Visit: Payer: Self-pay | Admitting: Internal Medicine

## 2016-09-26 DIAGNOSIS — I10 Essential (primary) hypertension: Secondary | ICD-10-CM

## 2016-11-11 ENCOUNTER — Ambulatory Visit (INDEPENDENT_AMBULATORY_CARE_PROVIDER_SITE_OTHER): Payer: Medicare Other | Admitting: Internal Medicine

## 2016-11-11 ENCOUNTER — Encounter: Payer: Self-pay | Admitting: Internal Medicine

## 2016-11-11 VITALS — BP 134/50 | HR 60 | Ht 71.0 in | Wt 138.0 lb

## 2016-11-11 DIAGNOSIS — E119 Type 2 diabetes mellitus without complications: Secondary | ICD-10-CM | POA: Diagnosis not present

## 2016-11-11 DIAGNOSIS — E782 Mixed hyperlipidemia: Secondary | ICD-10-CM

## 2016-11-11 DIAGNOSIS — R011 Cardiac murmur, unspecified: Secondary | ICD-10-CM | POA: Insufficient documentation

## 2016-11-11 DIAGNOSIS — R413 Other amnesia: Secondary | ICD-10-CM

## 2016-11-11 DIAGNOSIS — I10 Essential (primary) hypertension: Secondary | ICD-10-CM

## 2016-11-11 DIAGNOSIS — H6123 Impacted cerumen, bilateral: Secondary | ICD-10-CM | POA: Diagnosis not present

## 2016-11-11 DIAGNOSIS — M62830 Muscle spasm of back: Secondary | ICD-10-CM

## 2016-11-11 NOTE — Patient Instructions (Signed)
Call ENT for ear wax removal - right is worse than left

## 2016-11-11 NOTE — Progress Notes (Signed)
Date:  11/11/2016   Name:  Ashlee Grimes   DOB:  03-29-1930   MRN:  [id number]  Accompanied today by daughters Carollee Herter and Coralee North. Chief Complaint: Memory Loss (Having trouble remembering everything. Confusion, Dizziness- 6CIT- 21) and Neck Pain (Right side pain- muscles hurting. Getting worse. Had a bad pain yesterday shoot up the side of her face from her neck. )  Neck Pain   This is a chronic problem. The problem occurs intermittently. The problem has been unchanged. The pain is associated with nothing. The pain is present in the occipital region. The quality of the pain is described as aching. Pertinent negatives include no chest pain, headaches or trouble swallowing. She has tried acetaminophen and heat (patient has not been using heat or massage regularly) for the symptoms. The treatment provided mild relief.  Hypertension  This is a chronic problem. The problem is controlled. Associated symptoms include neck pain. Pertinent negatives include no chest pain, headaches, peripheral edema, shortness of breath or sweats. There are no associated agents to hypertension. Risk factors for coronary artery disease include diabetes mellitus. Past treatments include ACE inhibitors. The current treatment provides significant improvement.  Diabetes  She presents for her follow-up diabetic visit. She has type 2 diabetes mellitus. Her disease course has been stable. Hypoglycemia symptoms include confusion and dizziness (c/o dizziness). Pertinent negatives for hypoglycemia include no headaches, nervousness/anxiousness, pallor, speech difficulty, sweats or tremors. Pertinent negatives for diabetes include no chest pain and no fatigue. Current diabetic treatment includes diet. Her weight is stable. There is no compliance (family is not checking regularly) with monitoring of blood glucose. An ACE inhibitor/angiotensin II receptor blocker is being taken.  Hyperlipidemia  This is a chronic problem. Pertinent  negatives include no chest pain or shortness of breath. Current antihyperlipidemic treatment includes statins. The current treatment provides significant improvement of lipids.  Memory loss -family has noticed gradual memory loss over the past year. She is requiring more and more assistance at home with dressing and bathing. She still endorses without assistance although sometimes her balance seems off she's had one fall without injury. Her appetite is good she sleeps well. She doesn't seem to be agitated although sometimes she does try to leave the house unassisted. Her 2 daughters, Carollee Herter and Coralee North, live with her and supervise her around the clock. Both of them work and are trying to get some home care assistance.  Ear fullness - has hx of cerumen impaction.  Rinsing with H2O2 has not been beneficial.   Review of Systems  Constitutional: Negative for appetite change, fatigue and unexpected weight change.  HENT: Positive for hearing loss. Negative for trouble swallowing.   Respiratory: Negative for chest tightness, shortness of breath and wheezing.   Cardiovascular: Negative for chest pain and leg swelling.  Musculoskeletal: Positive for arthralgias (knees) and neck pain. Negative for gait problem and joint swelling.  Skin: Negative for pallor and wound.  Neurological: Positive for dizziness (c/o dizziness). Negative for tremors, speech difficulty and headaches.  Psychiatric/Behavioral: Positive for confusion. Negative for agitation and sleep disturbance. The patient is not nervous/anxious.     Patient Active Problem List   Diagnosis Date Noted  . Type 2 diabetes mellitus without complication, without long-term current use of insulin (HCC) 07/10/2015  . Muscle spasm of back 07/10/2015  . Environmental and seasonal allergies 07/10/2015  . Cerumen impaction 07/10/2015  . Essential (primary) hypertension 08/29/2014  . Combined fat and carbohydrate induced hyperlipemia 08/29/2014  . Arthritis  of  knee, degenerative 08/29/2014    Prior to Admission medications   Medication Sig Start Date End Date Taking? Authorizing Provider  ACCU-CHEK SOFTCLIX LANCETS lancets Use as instructed 12/16/14  Yes Reubin Milan, MD  amLODipine (NORVASC) 5 MG tablet TAKE 1 TABLET (5 MG TOTAL) BY MOUTH DAILY. 09/26/16  Yes Reubin Milan, MD  aspirin 81 MG tablet Take 1 tablet by mouth daily.   Yes [provider]  glucose blood (ACCU-CHEK AVIVA PLUS) test strip 1 each by Other route daily. 12/12/14  Yes Reubin Milan, MD  hydrochlorothiazide (HYDRODIURIL) 25 MG tablet TAKE 1 TABLET (25 MG TOTAL) BY MOUTH DAILY. 09/26/16  Yes Reubin Milan, MD  hydrocortisone cream 0.5 % Apply 1 application topically 2 (two) times daily.   Yes [provider]  ramipril (ALTACE) 2.5 MG capsule TAKE 1 CAPSULE (2.5 MG TOTAL) BY MOUTH DAILY. 09/26/16  Yes Reubin Milan, MD  simvastatin (ZOCOR) 20 MG tablet TAKE 1 TABLET (20 MG TOTAL) BY MOUTH DAILY AT 6 PM. 09/26/16  Yes Reubin Milan, MD    No Known Allergies  Past Surgical History:  Procedure Laterality Date  . ABDOMINAL HYSTERECTOMY  1980  . BREAST BIOPSY    . CATARACT EXTRACTION W/PHACO Right 10/12/2015   Procedure: CATARACT EXTRACTION PHACO AND INTRAOCULAR LENS PLACEMENT (IOC);  Surgeon: Sherald Hess, MD;  [facility];  Service: Ophthalmology;  Laterality: Right;  RIGHT  . CATARACT EXTRACTION W/PHACO Left 12/14/2015   Procedure: CATARACT EXTRACTION PHACO AND INTRAOCULAR LENS PLACEMENT (IOC);  Surgeon: Sherald Hess, MD;  Location: [facility];  Service: Ophthalmology;  Laterality: Left;  DIABETIC - oral meds LEFT  . TONSILLECTOMY      Social History  Substance Use Topics  . Smoking status: Former Smoker    Quit date: 03/07/2005  . Smokeless tobacco: Never Used  . Alcohol use No     Medication list has been reviewed and updated.  PHQ 2/9 Scores 11/11/2016 07/10/2015  PHQ - 2 Score 1  0    Physical Exam  Constitutional: She appears well-developed.  HENT:  Head: Normocephalic and atraumatic.  Neck: Normal range of motion.  Cardiovascular: Normal rate, regular rhythm and intact distal pulses.   Murmur heard.  Systolic murmur is present with a grade of 1/6  Pulmonary/Chest: Effort normal and breath sounds normal. No respiratory distress. She has no wheezes.  Abdominal: Soft. There is no tenderness.  Musculoskeletal: Normal range of motion.       Cervical back: She exhibits spasm. She exhibits no tenderness and no bony tenderness.  Neurological: She is alert. She has normal strength and normal reflexes. She displays no tremor. No sensory deficit. Coordination normal.  Oriented x 2  Skin: Skin is warm and dry. No rash noted.  Psychiatric: She has a normal mood and affect. Her speech is normal and behavior is normal. Thought content normal.  Nursing note and vitals reviewed.   BP (!) 134/50   Pulse 60   Ht  (1.803 m)   Wt 138 lb (62.6 kg)   SpO2 99%   BMI 19.25 kg/m   Assessment and Plan: 1. Essential (primary) hypertension controlled - CBC with Differential/Platelet  2. Type 2 diabetes mellitus without complication, without long-term current use of insulin (HCC) Continue diet Family will schedule Ophthalmology exam - Comprehensive metabolic panel - Hemoglobin A1c  3. Bilateral impacted cerumen Recommend ENT removal  4. Combined fat and carbohydrate induced hyperlipemia Continue statin therapy -  Lipid panel  5. Memory loss Continue family care Form for CAP assistance partially completed for family - TSH - Vitamin B12 - Ambulatory referral to Neurology  6. Muscle spasm of back Continue heat, massage, tylenol as needed  Patient declines all vaccines - family supports her wishes  No orders of the defined types were placed in this encounter.   Partially dictated using Animal nutritionistDragon software. Any errors are unintentional.  Bari EdwardLaura Lillis Nuttle,  MD [facility]  11/11/2016

## 2016-11-12 LAB — CBC WITH DIFFERENTIAL/PLATELET
BASOS ABS: 0 10*3/uL (ref 0.0–0.2)
Basos: 1 %
EOS (ABSOLUTE): 0.2 10*3/uL (ref 0.0–0.4)
Eos: 3 %
HEMOGLOBIN: 13.3 g/dL (ref 11.1–15.9)
Hematocrit: 40.5 % (ref 34.0–46.6)
IMMATURE GRANS (ABS): 0 10*3/uL (ref 0.0–0.1)
IMMATURE GRANULOCYTES: 0 %
LYMPHS: 38 %
Lymphocytes Absolute: 1.8 10*3/uL (ref 0.7–3.1)
MCH: 31.5 pg (ref 26.6–33.0)
MCHC: 32.8 g/dL (ref 31.5–35.7)
MCV: 96 fL (ref 79–97)
MONOCYTES: 6 %
Monocytes Absolute: 0.3 10*3/uL (ref 0.1–0.9)
Neutrophils Absolute: 2.5 10*3/uL (ref 1.4–7.0)
Neutrophils: 52 %
Platelets: 206 10*3/uL (ref 150–379)
RBC: 4.22 x10E6/uL (ref 3.77–5.28)
RDW: 14.5 % (ref 12.3–15.4)
WBC: 4.8 10*3/uL (ref 3.4–10.8)

## 2016-11-12 LAB — COMPREHENSIVE METABOLIC PANEL
ALBUMIN: 4.1 g/dL (ref 3.5–4.7)
ALK PHOS: 46 IU/L (ref 39–117)
ALT: 12 IU/L (ref 0–32)
AST: 19 IU/L (ref 0–40)
Albumin/Globulin Ratio: 1.5 (ref 1.2–2.2)
BUN / CREAT RATIO: 17 (ref 12–28)
BUN: 16 mg/dL (ref 8–27)
Bilirubin Total: 0.5 mg/dL (ref 0.0–1.2)
CO2: 27 mmol/L (ref 20–29)
CREATININE: 0.92 mg/dL (ref 0.57–1.00)
Calcium: 9.1 mg/dL (ref 8.7–10.3)
Chloride: 99 mmol/L (ref 96–106)
GFR calc non Af Amer: 57 mL/min/{1.73_m2} — ABNORMAL LOW (ref >59)
GFR, EST AFRICAN AMERICAN: 66 mL/min/{1.73_m2} (ref >59)
GLUCOSE: 86 mg/dL (ref 65–99)
Globulin, Total: 2.8 g/dL (ref 1.5–4.5)
Potassium: 3.8 mmol/L (ref 3.5–5.2)
Sodium: 139 mmol/L (ref 134–144)
TOTAL PROTEIN: 6.9 g/dL (ref 6.0–8.5)

## 2016-11-12 LAB — TSH: TSH: 0.646 u[IU]/mL (ref 0.450–4.500)

## 2016-11-12 LAB — LIPID PANEL
CHOL/HDL RATIO: 2.1 ratio (ref 0.0–4.4)
Cholesterol, Total: 159 mg/dL (ref 100–199)
HDL: 77 mg/dL (ref >39)
LDL Calculated: 73 mg/dL (ref 0–99)
TRIGLYCERIDES: 46 mg/dL (ref 0–149)
VLDL Cholesterol Cal: 9 mg/dL (ref 5–40)

## 2016-11-12 LAB — HEMOGLOBIN A1C
Est. average glucose Bld gHb Est-mCnc: 126 mg/dL
HEMOGLOBIN A1C: 6 % — AB (ref 4.8–5.6)

## 2016-11-12 LAB — VITAMIN B12: Vitamin B-12: 820 pg/mL (ref 232–1245)

## 2016-11-14 ENCOUNTER — Telehealth: Payer: Self-pay

## 2016-11-14 NOTE — Telephone Encounter (Signed)
Spoke to pt's daughterCoralee North- Donja- About pts recent lab results. Informed her Dm is excellent and other labs are normal. She verbalized understanding.

## 2016-11-17 DIAGNOSIS — G44201 Tension-type headache, unspecified, intractable: Secondary | ICD-10-CM | POA: Diagnosis not present

## 2016-11-17 DIAGNOSIS — E538 Deficiency of other specified B group vitamins: Secondary | ICD-10-CM | POA: Diagnosis not present

## 2016-11-17 DIAGNOSIS — Z79899 Other long term (current) drug therapy: Secondary | ICD-10-CM | POA: Diagnosis not present

## 2016-11-17 DIAGNOSIS — G301 Alzheimer's disease with late onset: Secondary | ICD-10-CM | POA: Diagnosis not present

## 2016-11-21 ENCOUNTER — Telehealth: Payer: Self-pay

## 2016-11-21 ENCOUNTER — Other Ambulatory Visit: Payer: Self-pay | Admitting: Internal Medicine

## 2016-11-21 DIAGNOSIS — N3001 Acute cystitis with hematuria: Secondary | ICD-10-CM

## 2016-11-21 MED ORDER — CIPROFLOXACIN HCL 250 MG PO TABS: 250.0000 mg | ORAL_TABLET | Freq: Two times a day (BID) | ORAL | 0 refills | Status: DC

## 2016-11-21 NOTE — Telephone Encounter (Signed)
Patients daughterCoralee North- called and left VM:  Patient seen Neurology and while at the visit stated patient has a UTI. Daughter stated Neuro at [facility] is going to send over information to see if we can call in antibiotic to clear UTI up. Please Advise.  CALLBACK#: [phone]

## 2016-11-21 NOTE — Telephone Encounter (Signed)
Called and left VM about sent antibiotics- and told to call office to schedule 2 week follow up

## 2016-11-21 NOTE — Telephone Encounter (Signed)
Antibiotics sent to [facility] in [place].  Please follow up with me in 2 weeks to document resolution.

## 2016-11-23 ENCOUNTER — Other Ambulatory Visit: Payer: Self-pay | Admitting: Neurology

## 2016-11-23 DIAGNOSIS — F0281 Dementia in other diseases classified elsewhere with behavioral disturbance: Secondary | ICD-10-CM

## 2016-11-23 DIAGNOSIS — G301 Alzheimer's disease with late onset: Principal | ICD-10-CM

## 2016-11-28 ENCOUNTER — Encounter: Payer: Medicare Other | Admitting: Internal Medicine

## 2016-11-30 ENCOUNTER — Ambulatory Visit
Admission: RE | Admit: 2016-11-30 | Discharge: 2016-11-30 | Disposition: A | Discharge status: Home / Self Care | Payer: Medicare Other | Source: Ambulatory Visit | Attending: Neurology | Admitting: Neurology

## 2016-11-30 DIAGNOSIS — G300 Alzheimer's disease with early onset: Secondary | ICD-10-CM | POA: Diagnosis not present

## 2016-11-30 DIAGNOSIS — G319 Degenerative disease of nervous system, unspecified: Secondary | ICD-10-CM | POA: Diagnosis not present

## 2016-11-30 DIAGNOSIS — F0281 Dementia in other diseases classified elsewhere with behavioral disturbance: Secondary | ICD-10-CM | POA: Insufficient documentation

## 2016-11-30 DIAGNOSIS — F02818 Dementia in other diseases classified elsewhere, unspecified severity, with other behavioral disturbance: Secondary | ICD-10-CM

## 2016-11-30 DIAGNOSIS — I6782 Cerebral ischemia: Secondary | ICD-10-CM | POA: Insufficient documentation

## 2016-11-30 DIAGNOSIS — G301 Alzheimer's disease with late onset: Secondary | ICD-10-CM | POA: Insufficient documentation

## 2016-11-30 MED ORDER — GADOBENATE DIMEGLUMINE 529 MG/ML IV SOLN
15.0000 mL | Freq: Once | INTRAVENOUS | Status: AC | PRN
  Administered 2016-11-30: 13 mL via INTRAVENOUS

## 2016-12-02 ENCOUNTER — Encounter: Payer: Self-pay | Admitting: Internal Medicine

## 2016-12-02 DIAGNOSIS — F0280 Dementia in other diseases classified elsewhere without behavioral disturbance: Secondary | ICD-10-CM | POA: Insufficient documentation

## 2016-12-02 DIAGNOSIS — G301 Alzheimer's disease with late onset: Secondary | ICD-10-CM

## 2016-12-05 ENCOUNTER — Ambulatory Visit (INDEPENDENT_AMBULATORY_CARE_PROVIDER_SITE_OTHER): Payer: Medicare Other | Admitting: Otolaryngology

## 2016-12-05 DIAGNOSIS — H6123 Impacted cerumen, bilateral: Secondary | ICD-10-CM | POA: Diagnosis not present

## 2016-12-05 DIAGNOSIS — H900 Conductive hearing loss, bilateral: Secondary | ICD-10-CM | POA: Diagnosis not present

## 2016-12-06 DIAGNOSIS — G301 Alzheimer's disease with late onset: Secondary | ICD-10-CM | POA: Diagnosis not present

## 2016-12-06 DIAGNOSIS — G44201 Tension-type headache, unspecified, intractable: Secondary | ICD-10-CM | POA: Diagnosis not present

## 2016-12-26 ENCOUNTER — Ambulatory Visit: Payer: Medicare Other | Admitting: Internal Medicine

## 2016-12-27 ENCOUNTER — Encounter: Payer: Self-pay | Admitting: Internal Medicine

## 2016-12-27 ENCOUNTER — Ambulatory Visit (INDEPENDENT_AMBULATORY_CARE_PROVIDER_SITE_OTHER): Payer: Medicare Other | Admitting: Internal Medicine

## 2016-12-27 VITALS — BP 132/64 | HR 62 | Ht 71.0 in | Wt 141.0 lb

## 2016-12-27 DIAGNOSIS — F0280 Dementia in other diseases classified elsewhere without behavioral disturbance: Secondary | ICD-10-CM

## 2016-12-27 DIAGNOSIS — G301 Alzheimer's disease with late onset: Secondary | ICD-10-CM

## 2016-12-27 DIAGNOSIS — I10 Essential (primary) hypertension: Secondary | ICD-10-CM

## 2016-12-27 NOTE — Progress Notes (Signed)
Date:  12/27/2016   Name:  Ashlee Grimes   DOB:  1931/02/08   MRN:  [id number]   Chief Complaint: Ashlee Grimes (For Adult Daycare. Needs filled out to assess daily level of care.) Ashlee Grimes's family is trying to get her into an adult daycare near her home in [place].  The forms are brought today to be completed.  Family reports that she is doing well.  She has mild Alzheimer's dementia but has no behavior issues, she does not wander or become aggressive.  She is walking and eating normally her sleep is intact.  Form is completed, medication list is attached, and provided to the family.  Lab Results  Component Value Date   HGBA1C 6.0 (H) 11/11/2016   Lab Results  Component Value Date   CREATININE 0.92 11/11/2016   BUN 16 11/11/2016   NA 139 11/11/2016   K 3.8 11/11/2016   CL 99 11/11/2016   CO2 27 11/11/2016    Review of Systems  Constitutional: Negative for appetite change, fever and unexpected weight change.  Respiratory: Negative for chest tightness and shortness of breath.   Cardiovascular: Negative for chest pain.  Gastrointestinal: Negative for abdominal pain, diarrhea and vomiting.  Musculoskeletal: Positive for arthralgias.  Neurological: Negative for dizziness, weakness and headaches.  Psychiatric/Behavioral: Negative for agitation, behavioral problems, hallucinations and sleep disturbance. The patient is not nervous/anxious.     Patient Active Problem List   Diagnosis Date Noted  . Alzheimer's type dementia with late onset without behavioral disturbance 12/02/2016  . Heart murmur 11/11/2016  . Type 2 diabetes mellitus without complication, without long-term current use of insulin (HCC) 07/10/2015  . Muscle spasm of back 07/10/2015  . Environmental and seasonal allergies 07/10/2015  . Cerumen impaction 07/10/2015  . Essential (primary) hypertension 08/29/2014  . Combined fat and carbohydrate induced hyperlipemia 08/29/2014  . Arthritis of knee,  degenerative 08/29/2014    Prior to Admission medications   Medication Sig Start Date End Date Taking? Authorizing Provider  ACCU-CHEK SOFTCLIX LANCETS lancets Use as instructed 12/16/14  Yes Reubin Milan, MD  amLODipine (NORVASC) 5 MG tablet TAKE 1 TABLET (5 MG TOTAL) BY MOUTH DAILY. 09/26/16  Yes Reubin Milan, MD  aspirin 81 MG tablet Take 1 tablet by mouth daily.   Yes [provider]  glucose blood (ACCU-CHEK AVIVA PLUS) test strip 1 each by Other route daily. 12/12/14  Yes Reubin Milan, MD  hydrochlorothiazide (HYDRODIURIL) 25 MG tablet TAKE 1 TABLET (25 MG TOTAL) BY MOUTH DAILY. 09/26/16  Yes Reubin Milan, MD  hydrocortisone cream 0.5 % Apply 1 application topically 2 (two) times daily.   Yes [provider]  ramipril (ALTACE) 2.5 MG capsule TAKE 1 CAPSULE (2.5 MG TOTAL) BY MOUTH DAILY. 09/26/16  Yes Reubin Milan, MD  simvastatin (ZOCOR) 20 MG tablet TAKE 1 TABLET (20 MG TOTAL) BY MOUTH DAILY AT 6 PM. 09/26/16  Yes Reubin Milan, MD  risperiDONE (RISPERDAL) 0.5 MG tablet Take 5 mg by mouth daily. 12/15/16   [provider]    No Known Allergies  Past Surgical History:  Procedure Laterality Date  . ABDOMINAL HYSTERECTOMY  1980  . BREAST BIOPSY    . CATARACT EXTRACTION W/PHACO Right 10/12/2015   Procedure: CATARACT EXTRACTION PHACO AND INTRAOCULAR LENS PLACEMENT (IOC);  Surgeon: Sherald Hess, MD;  Location: [facility];  Service: Ophthalmology;  Laterality: Right;  RIGHT  . CATARACT EXTRACTION W/PHACO Left 12/14/2015   Procedure: CATARACT EXTRACTION  PHACO AND INTRAOCULAR LENS PLACEMENT (IOC);  Surgeon: Sherald HessAnita Prakash Vin-Parikh, MD;  [facility];  Service: Ophthalmology;  Laterality: Left;  DIABETIC - oral meds LEFT  . TONSILLECTOMY      Social History  Substance Use Topics  . Smoking status: Former Smoker    Quit date: 03/07/2005  . Smokeless tobacco: Never Used  . Alcohol use No      Medication list has been reviewed and updated.  PHQ 2/9 Scores 11/11/2016 07/10/2015  PHQ - 2 Score 1 0    Physical Exam  Constitutional: She is oriented to person, place, and time. She appears well-developed. No distress.  HENT:  Head: Normocephalic and atraumatic.  Neck: Normal range of motion. Neck supple. No thyromegaly present.  Cardiovascular: Normal rate, regular rhythm and normal heart sounds.   Pulmonary/Chest: Effort normal and breath sounds normal. No respiratory distress. She has no wheezes.  Musculoskeletal: She exhibits no edema.  Neurological: She is alert and oriented to person, place, and time.  Skin: Skin is warm and dry. No rash noted.  Psychiatric: She has a normal mood and affect. Her speech is normal and behavior is normal.  Nursing note and vitals reviewed.   BP 132/64   Pulse 62   Ht 5\' 11"  (1.803 m)   Wt 141 lb (64 kg)   SpO2 100%   BMI 19.67 kg/m   Assessment and Plan: 1. Alzheimer's type dementia with late onset without behavioral disturbance Form for daycare completed Appears stable without decline or behavioral issues  2. Essential (primary) hypertension controlled   No orders of the defined types were placed in this encounter.   Partially dictated using Animal nutritionistDragon software. Any errors are unintentional.  Bari EdwardLaura Hafsah Hendler, MD [facility]  12/27/2016

## 2017-02-14 ENCOUNTER — Ambulatory Visit: Payer: Medicare Other | Admitting: Internal Medicine

## 2017-03-16 ENCOUNTER — Ambulatory Visit: Payer: Medicare Other

## 2017-03-16 ENCOUNTER — Encounter: Payer: Medicare Other | Admitting: Internal Medicine

## 2017-03-31 ENCOUNTER — Other Ambulatory Visit: Payer: Self-pay | Admitting: Internal Medicine

## 2017-03-31 DIAGNOSIS — I10 Essential (primary) hypertension: Secondary | ICD-10-CM

## 2017-03-31 NOTE — Telephone Encounter (Signed)
Save this message and please call the patient again tomorrow if she does not return call. I would contact these patient's more than once, and just document when you do.

## 2017-03-31 NOTE — Telephone Encounter (Signed)
She  callled back after I lvm, its been scheduled.

## 2017-03-31 NOTE — Telephone Encounter (Signed)
lvm to call and set up appt.

## 2017-06-06 ENCOUNTER — Ambulatory Visit: Payer: Medicare Other | Admitting: Internal Medicine

## 2017-12-11 ENCOUNTER — Ambulatory Visit (INDEPENDENT_AMBULATORY_CARE_PROVIDER_SITE_OTHER): Payer: Medicare Other | Admitting: Otolaryngology

## 2018-03-15 ENCOUNTER — Encounter: Payer: Self-pay | Admitting: Emergency Medicine

## 2018-03-15 ENCOUNTER — Other Ambulatory Visit: Payer: Self-pay

## 2018-03-15 ENCOUNTER — Emergency Department: Payer: No Typology Code available for payment source

## 2018-03-15 ENCOUNTER — Emergency Department
Admission: EM | Admit: 2018-03-15 | Discharge: 2018-03-15 | Disposition: A | Discharge status: Home / Self Care | Payer: No Typology Code available for payment source | Attending: Emergency Medicine | Admitting: Emergency Medicine

## 2018-03-15 DIAGNOSIS — Z87891 Personal history of nicotine dependence: Secondary | ICD-10-CM | POA: Diagnosis not present

## 2018-03-15 DIAGNOSIS — R451 Restlessness and agitation: Secondary | ICD-10-CM | POA: Diagnosis not present

## 2018-03-15 DIAGNOSIS — R319 Hematuria, unspecified: Secondary | ICD-10-CM | POA: Insufficient documentation

## 2018-03-15 DIAGNOSIS — R410 Disorientation, unspecified: Secondary | ICD-10-CM | POA: Diagnosis not present

## 2018-03-15 DIAGNOSIS — I10 Essential (primary) hypertension: Secondary | ICD-10-CM | POA: Insufficient documentation

## 2018-03-15 DIAGNOSIS — G309 Alzheimer's disease, unspecified: Secondary | ICD-10-CM | POA: Insufficient documentation

## 2018-03-15 DIAGNOSIS — E119 Type 2 diabetes mellitus without complications: Secondary | ICD-10-CM | POA: Diagnosis not present

## 2018-03-15 DIAGNOSIS — D649 Anemia, unspecified: Secondary | ICD-10-CM | POA: Insufficient documentation

## 2018-03-15 DIAGNOSIS — N3090 Cystitis, unspecified without hematuria: Secondary | ICD-10-CM | POA: Diagnosis not present

## 2018-03-15 LAB — URINALYSIS, COMPLETE (UACMP) WITH MICROSCOPIC
Bilirubin Urine: NEGATIVE
Glucose, UA: NEGATIVE mg/dL
Ketones, ur: NEGATIVE mg/dL
Leukocytes, UA: NEGATIVE
Nitrite: NEGATIVE
PH: 7 (ref 5.0–8.0)
Protein, ur: 100 mg/dL — AB
RBC / HPF: >50 RBC/hpf — ABNORMAL HIGH (ref 0–5)
Specific Gravity, Urine: 1.016 (ref 1.005–1.030)

## 2018-03-15 LAB — CBC WITH DIFFERENTIAL/PLATELET
Abs Immature Granulocytes: 0.01 10*3/uL (ref 0.00–0.07)
Basophils Absolute: 0 10*3/uL (ref 0.0–0.1)
Basophils Relative: 0 %
Eosinophils Absolute: 0 10*3/uL (ref 0.0–0.5)
Eosinophils Relative: 0 %
HCT: 27.5 % — ABNORMAL LOW (ref 36.0–46.0)
HEMOGLOBIN: 9 g/dL — AB (ref 12.0–15.0)
Immature Granulocytes: 0 %
Lymphocytes Relative: 13 %
Lymphs Abs: 0.8 10*3/uL (ref 0.7–4.0)
MCH: 32.1 pg (ref 26.0–34.0)
MCHC: 32.7 g/dL (ref 30.0–36.0)
MCV: 98.2 fL (ref 80.0–100.0)
Monocytes Absolute: 0.4 10*3/uL (ref 0.1–1.0)
Monocytes Relative: 7 %
NRBC: 0 % (ref 0.0–0.2)
Neutro Abs: 4.5 10*3/uL (ref 1.7–7.7)
Neutrophils Relative %: 80 %
Platelets: 178 10*3/uL (ref 150–400)
RBC: 2.8 MIL/uL — AB (ref 3.87–5.11)
RDW: 13.1 % (ref 11.5–15.5)
WBC: 5.7 10*3/uL (ref 4.0–10.5)

## 2018-03-15 LAB — COMPREHENSIVE METABOLIC PANEL
ALBUMIN: 3.2 g/dL — AB (ref 3.5–5.0)
ALK PHOS: 38 U/L (ref 38–126)
ALT: 14 U/L (ref 0–44)
AST: 26 U/L (ref 15–41)
Anion gap: 6 (ref 5–15)
BUN: 31 mg/dL — ABNORMAL HIGH (ref 8–23)
CO2: 29 mmol/L (ref 22–32)
Calcium: 8.6 mg/dL — ABNORMAL LOW (ref 8.9–10.3)
Chloride: 101 mmol/L (ref 98–111)
Creatinine, Ser: 0.69 mg/dL (ref 0.44–1.00)
GFR calc Af Amer: >60 mL/min (ref >60)
GFR calc non Af Amer: >60 mL/min (ref >60)
Glucose, Bld: 155 mg/dL — ABNORMAL HIGH (ref 70–99)
Potassium: 3.9 mmol/L (ref 3.5–5.1)
SODIUM: 136 mmol/L (ref 135–145)
Total Bilirubin: 0.5 mg/dL (ref 0.3–1.2)
Total Protein: 5.9 g/dL — ABNORMAL LOW (ref 6.5–8.1)

## 2018-03-15 LAB — TROPONIN I: Troponin I: 0.03 ng/mL (ref <0.03)

## 2018-03-15 LAB — TYPE AND SCREEN
ABO/RH(D): O POS
Antibody Screen: NEGATIVE

## 2018-03-15 MED ORDER — TRANEXAMIC ACID 650 MG PO TABS: 1300.0000 mg | ORAL_TABLET | Freq: Three times a day (TID) | ORAL | 0 refills | Status: AC

## 2018-03-15 MED ORDER — SODIUM CHLORIDE 0.9 % IV SOLN
1.0000 g | Freq: Once | INTRAVENOUS | Status: AC
  Administered 2018-03-15: 1 g via INTRAVENOUS
  Filled 2018-03-15: qty 10

## 2018-03-15 MED ORDER — LORAZEPAM 2 MG/ML IJ SOLN
2.0000 mg | Freq: Once | INTRAMUSCULAR | Status: AC
  Administered 2018-03-15: 2 mg via INTRAMUSCULAR
  Filled 2018-03-15: qty 1

## 2018-03-15 MED ORDER — HALOPERIDOL LACTATE 5 MG/ML IJ SOLN
10.0000 mg | Freq: Once | INTRAMUSCULAR | Status: AC
  Administered 2018-03-15: 10 mg via INTRAMUSCULAR
  Filled 2018-03-15: qty 2

## 2018-03-15 MED ORDER — IOHEXOL 300 MG/ML  SOLN
75.0000 mL | Freq: Once | INTRAMUSCULAR | Status: AC | PRN
  Administered 2018-03-15: 75 mL via INTRAVENOUS

## 2018-03-15 MED ORDER — TRANEXAMIC ACID-NACL 1000-0.7 MG/100ML-% IV SOLN
1000.0000 mg | INTRAVENOUS | Status: AC
  Administered 2018-03-15: 1000 mg via INTRAVENOUS
  Filled 2018-03-15: qty 100

## 2018-03-15 MED ORDER — CIPROFLOXACIN HCL 500 MG PO TABS: 500.0000 mg | ORAL_TABLET | Freq: Two times a day (BID) | ORAL | 0 refills | Status: AC

## 2018-03-15 NOTE — ED Notes (Signed)
Patient transported to CT 

## 2018-03-15 NOTE — ED Notes (Signed)
Date and time results received: 03/15/18 1140(use smartphrase ".now" to insert current time)  Test: troponin Critical Value: 0.03  Name of Provider Notified: Mayford Knife  Orders Received? Or Actions Taken?: Orders Received - See Orders for details

## 2018-03-15 NOTE — ED Provider Notes (Signed)
[facility] Emergency Department Provider Note       Time seen: ----------------------------------------- 9:58 AM on 03/15/2018 -----------------------------------------  Level V caveat: History/ROS limited by altered mental status, dementia I have reviewed the triage vital signs and the nursing notes.  HISTORY   Chief Complaint Hematuria    HPI Ashlee Grimes is a 85 y.o. female with a history of hyperlipidemia, hypertension, shingles, dementia, diabetes who presents to the ED for agitation, confusion and hematuria.  Daughter states she has had blood in her urine for the past 4 days, she has attempted to hit and yell at staff.  The physician with [facility] states that this was believed to be vaginal bleeding recently.  She is reportedly status post hysterectomy however.  Past Medical History:  Diagnosis Date  . Heart murmur   . Hyperlipidemia   . Hypertension   . Shingles 07/2015    Patient Active Problem List   Diagnosis Date Noted  . Alzheimer's type dementia with late onset without behavioral disturbance (HCC) 12/02/2016  . Heart murmur 11/11/2016  . Type 2 diabetes mellitus without complication, without long-term current use of insulin (HCC) 07/10/2015  . Muscle spasm of back 07/10/2015  . Environmental and seasonal allergies 07/10/2015  . Cerumen impaction 07/10/2015  . Essential (primary) hypertension 08/29/2014  . Combined fat and carbohydrate induced hyperlipemia 08/29/2014  . Arthritis of knee, degenerative 08/29/2014    Past Surgical History:  Procedure Laterality Date  . ABDOMINAL HYSTERECTOMY  1980  . BREAST BIOPSY    . CATARACT EXTRACTION W/PHACO Right 10/12/2015   Procedure: CATARACT EXTRACTION PHACO AND INTRAOCULAR LENS PLACEMENT (IOC);  Surgeon: Sherald HessAnita Prakash Vin-Parikh, MD;  [facility];  Service: Ophthalmology;  Laterality: Right;  RIGHT  . CATARACT EXTRACTION W/PHACO Left 12/14/2015   Procedure: CATARACT  EXTRACTION PHACO AND INTRAOCULAR LENS PLACEMENT (IOC);  Surgeon: Sherald HessAnita Prakash Vin-Parikh, MD;  [facility];  Service: Ophthalmology;  Laterality: Left;  DIABETIC - oral meds LEFT  . TONSILLECTOMY      Allergies Patient has no known allergies.  Social History Social History   Tobacco Use  . Smoking status: Former Smoker    Last attempt to quit: 03/07/2005    Years since quitting: 13.0  . Smokeless tobacco: Never Used  Substance Use Topics  . Alcohol use: No    Alcohol/week: 0.0 standard drinks  . Drug use: No    Review of Systems Unknown, possible vaginal bleeding versus hematuria  All systems negative/normal/unremarkable except as stated in the HPI  ____________________________________________   PHYSICAL EXAM:  VITAL SIGNS: ED Triage Vitals [03/15/18 0943]  Enc Vitals Group     BP      Pulse      Resp 20     Temp (!) 97.5 F (36.4 C)     Temp Source Axillary     SpO2      Weight 120 lb (54.4 kg)     Height 5\' 10"  (1.778 m)     Head Circumference      Peak Flow      Pain Score      Pain Loc      Pain Edu?      Excl. in GC?    Constitutional: Alert but disoriented and agitated, mild distress Eyes: Conjunctivae are normal. Normal extraocular movements. Cardiovascular: Normal rate, regular rhythm. No murmurs, rubs, or gallops. Respiratory: Normal respiratory effort without tachypnea nor retractions. Breath sounds are clear and equal bilaterally. No wheezes/rales/rhonchi. Gastrointestinal: Soft  and nontender. Normal bowel sounds Musculoskeletal: Nontender with normal range of motion in extremities. No lower extremity tenderness nor edema. Neurologic: Confused and agitated, moving all extremities, will not cooperate with examination Skin:  Skin is warm, dry and intact. No rash noted. Psychiatric: Hostile mood and affect at times. ____________________________________________  EKG: Interpreted by me, sinus tachycardia with a rate of 100 bpm, wide  QRS, long QT  ED COURSE:  As part of my medical decision making, I reviewed the following data within the electronic MEDICAL RECORD NUMBER History obtained from family if available, nursing notes, old chart and ekg, as well as notes from prior ED visits. Patient presented for possible bleeding, we will assess with labs and imaging as indicated at this time.  Patient had to be chemically sedated to care for her she was so agitated.  She received IM Haldol and Ativan and is resting comfortably at this time.   Procedures ____________________________________________   LABS (pertinent positives/negatives)  Labs Reviewed  CBC WITH DIFFERENTIAL/PLATELET - Abnormal; Notable for the following components:      Result Value   RBC 2.80 (*)    Hemoglobin 9.0 (*)    HCT 27.5 (*)    All other components within normal limits  COMPREHENSIVE METABOLIC PANEL - Abnormal; Notable for the following components:   Glucose, Bld 155 (*)    BUN 31 (*)    Calcium 8.6 (*)    Total Protein 5.9 (*)    Albumin 3.2 (*)    All other components within normal limits  TROPONIN I - Abnormal; Notable for the following components:   Troponin I 0.03 (*)    All other components within normal limits  URINALYSIS, COMPLETE (UACMP) WITH MICROSCOPIC - Abnormal; Notable for the following components:   Color, Urine AMBER (*)    APPearance CLOUDY (*)    Hgb urine dipstick LARGE (*)    Protein, ur 100 (*)    RBC / HPF >50 (*)    WBC, UA >50 (*)    Bacteria, UA MANY (*)    All other components within normal limits  URINE CULTURE  TYPE AND SCREEN    RADIOLOGY Images were viewed by me  CT the abdomen pelvis with contrast IMPRESSION: 1. No acute findings within the abdomen or pelvis. 2. Small right renal calculi. 3. Focal area of asymmetric increased density in the left posterior bladder wall. A focal bladder lesion is suspected. Consider further evaluation with hematuria protocol CT or direct visualization. 4.  Indeterminate hyperdense lesion in the right kidney may represent a hemorrhagic/proteinaceous cyst or enhancing kidney lesion. Further evaluation with nonemergent contrast enhanced MR of the kidneys advised. 5.  Aortic Atherosclerosis (ICD10-I70.0). ____________________________________________   DIFFERENTIAL DIAGNOSIS   Cystitis, hematuria, vaginal bleeding, rectal bleeding, cancer  FINAL ASSESSMENT AND PLAN  Hematuria, cystitis, anemia  Plan: The patient had presented for bleeding that was believed to be either vaginal or urinary in origin. Patient's labs did reveal anemia and cystitis with likely some component of infection.  We did send a urine culture and give IV antibiotics. Patient's imaging revealed what looks like a left posterior bladder wall lesion that would need to have direct visualization if possible.  The complicating factors of course her advanced dementia.  She may also have a kidney lesion of undetermined significance.  She may require blood transfusion at some point but again she does have advanced dementia, I am not sure how bladder cancer could effectively be treated with her dementia as it  is.  She will be referred to outpatient urology if so desired.   Ulice Dash, MD    Note: This note was generated in part or whole with voice recognition software. Voice recognition is usually quite accurate but there are transcription errors that can and very often do occur. I apologize for any typographical errors that were not detected and corrected.     Emily Filbert, MD 03/15/18 1235

## 2018-03-15 NOTE — ED Notes (Addendum)
Pt agitated to the point of needing sedation. Pt's daughter and staff from [employer] [employer] present.

## 2018-03-15 NOTE — ED Notes (Signed)
Called pharmacy and they stated medication was sent to OR and they would call and get it sent to ED

## 2018-03-15 NOTE — ED Notes (Signed)
This RN in room to attempt to get vital signs and IV/blood work.  Pt in room pacing around, pt refuses to sit in bed. RN asked pt if she could take her blood pressure to which pt replied "no, that mean". RN asked pt if she could take her temperature and pt refused. Pt continues to pace around the room and appears to be agitated. Social worker from [facility] stated that pt may do better with African-American nurse due to her history. RN informed Child psychotherapist that I would try to arrange this. Charge RN, Research scientist (physical sciences) informed of above.

## 2018-03-15 NOTE — ED Notes (Signed)
Staff unable to obtain vital signs at this time because patient is combative.

## 2018-03-15 NOTE — ED Triage Notes (Signed)
Patient presents to the ED with blood in her urine x 4 days per daughter.  Patient is agitated and confused.  Patient has attempted to hit and yell at staff.  Patient's daughter states patient is often very confused but she is somewhat worse than normal after being in respite care for 4 days.

## 2018-03-17 LAB — URINE CULTURE: Culture: >=100000 — AB

## 2018-05-12 IMAGING — MR MR HEAD WO/W CM
9 of 12 series · 34 of 48 positions shown · IV contrast (multihance)
Comparison: None.

CLINICAL DATA: Late onset Alzheimer's disease with behavioral
disturbance

EXAM:
MRI HEAD WITHOUT AND WITH CONTRAST
TECHNIQUE: Multiplanar, multiecho pulse sequences of the brain and surrounding
structures were obtained without and with intravenous contrast.
CONTRAST:  13 mL MultiHance IV

[Series 2: GRE · sagittal · 5.0mm · 0.45mm/px · 2 of 26 slices shown (1 of 3)]
[im 1/26]
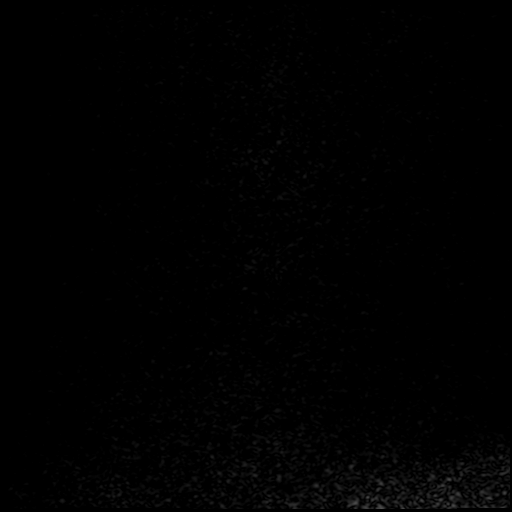
[im 26/26]
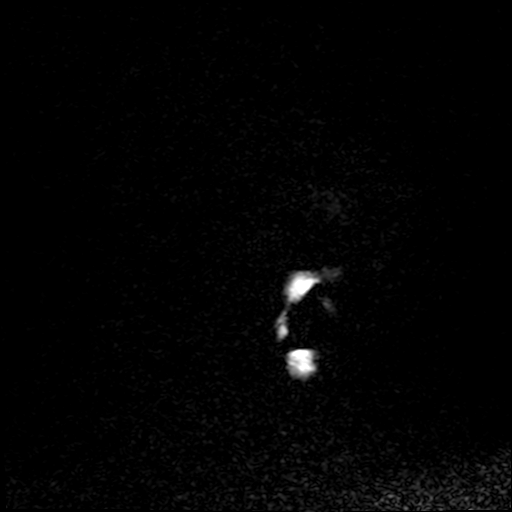

[Series 4: DWI · axial · 3.0mm · 1.80mm/px · z∈[-51,+110]mm · 6 of 54 slices shown (1 of 2)]
[im 1/54]
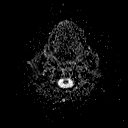
[im 11/54]
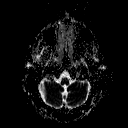
[im 22/54]
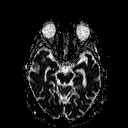
[im 32/54]
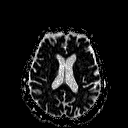
[im 43/54]
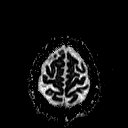
[im 54/54]
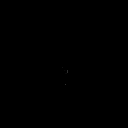

[Series 6: DWI · coronal · 3.0mm · 1.80mm/px · 5 of 49 slices shown (2 of 2)]
[im 1/49]
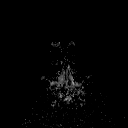
[im 13/49]
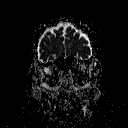
[im 25/49]
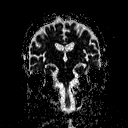
[im 37/49]
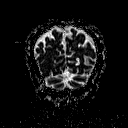
[im 49/49]
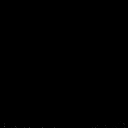

[Series 7: T2 · axial · 5.0mm · 0.45mm/px · z∈[-51,+105]mm · 3 of 25 slices shown (1 of 3)]
[im 1/25]
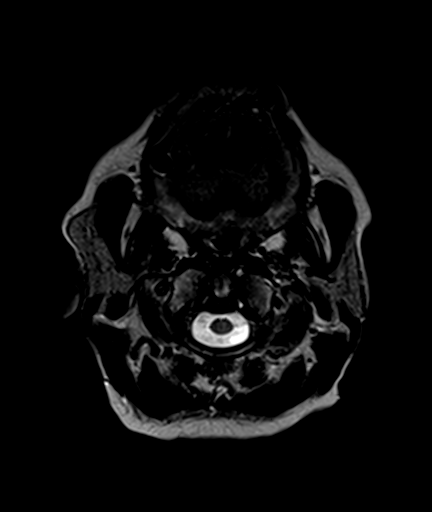
[im 13/25]
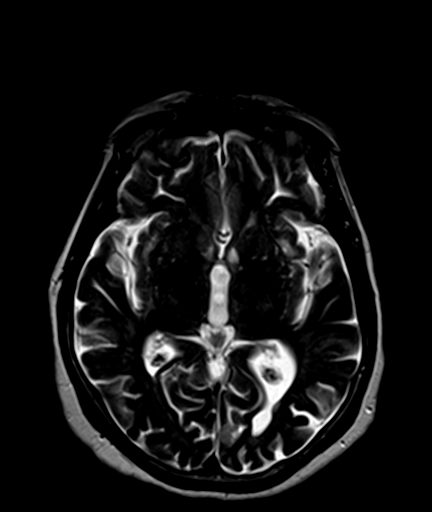
[im 25/25]
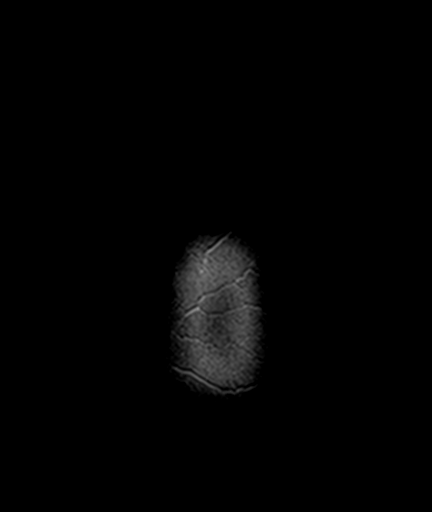

[Series 8: FLAIR · axial · 3.0mm · 0.45mm/px · z∈[-51,+105]mm · 6 of 53 slices shown]
[im 1/53]
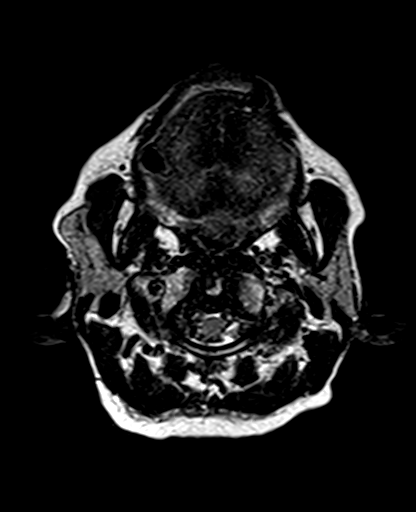
[im 11/53]
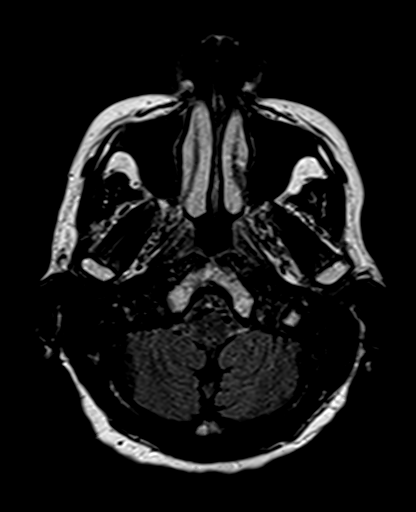
[im 21/53]
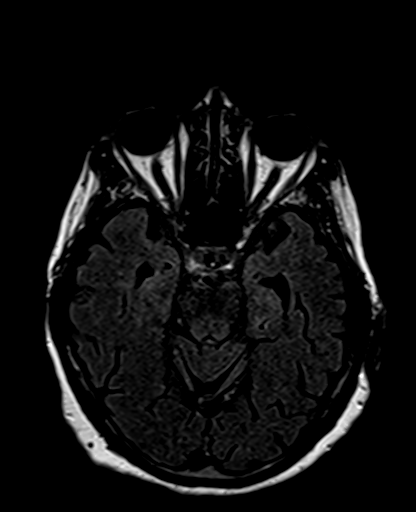
[im 32/53]
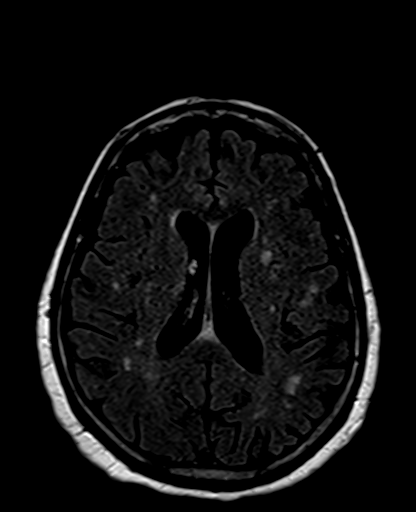
[im 42/53]
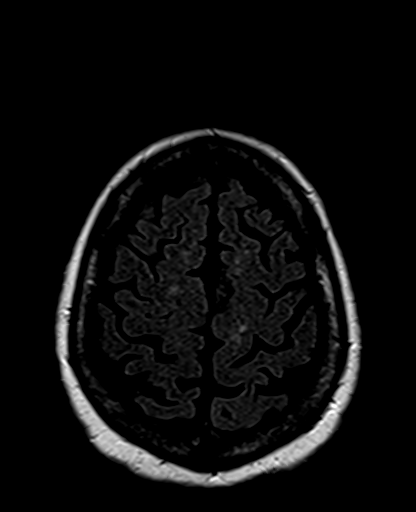
[im 53/53]
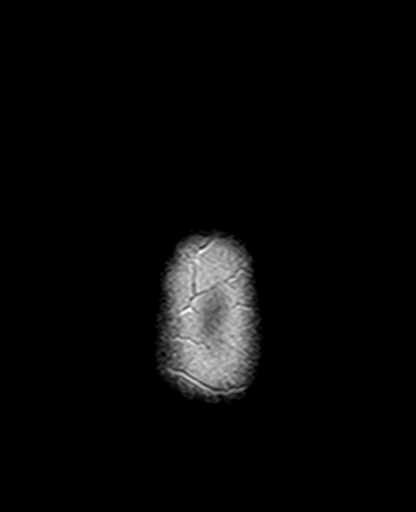

[Series 9: T2 · axial · 5.0mm · 1.20mm/px · z∈[-52,+104]mm · 3 of 25 slices shown (2 of 3)]
[im 1/25]
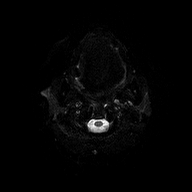
[im 13/25]
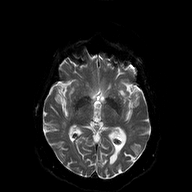
[im 25/25]
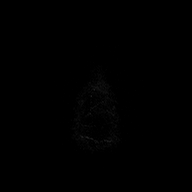

[Series 10: GRE · axial · 5.0mm · 0.45mm/px · z∈[-52,+104]mm · 3 of 25 slices shown (2 of 3)]
[im 1/25]
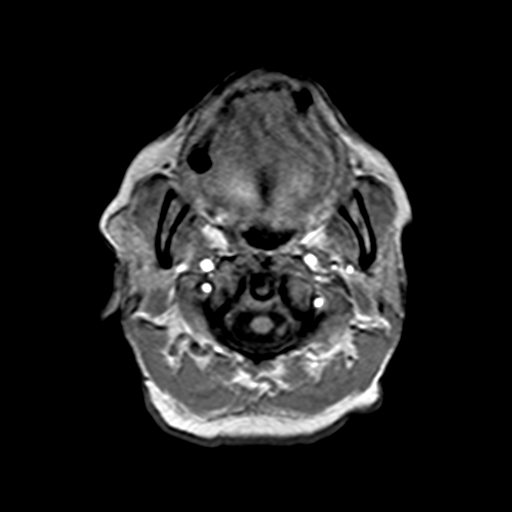
[im 13/25]
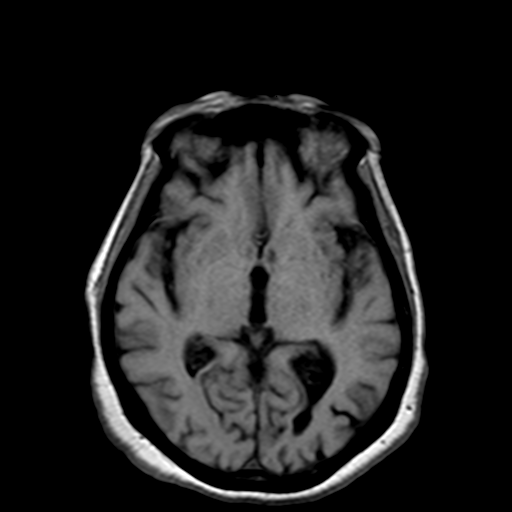
[im 25/25]
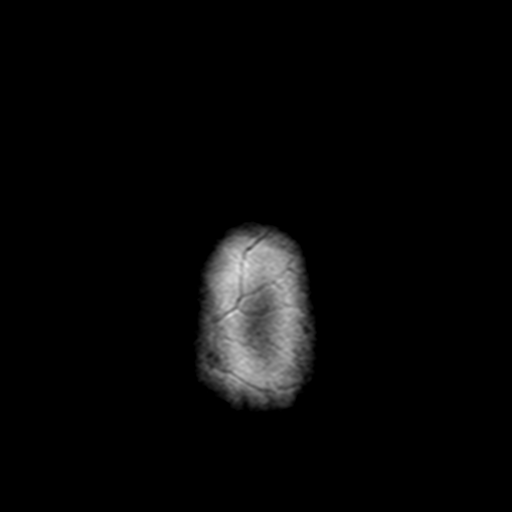

[Series 11: T2 · coronal · 5.0mm · 0.45mm/px · 3 of 25 slices shown (3 of 3)]
[im 1/25]
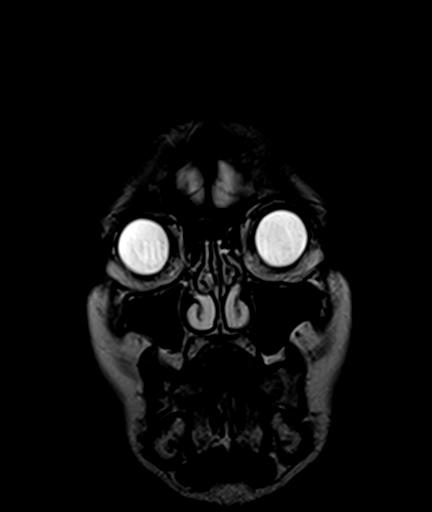
[im 13/25]
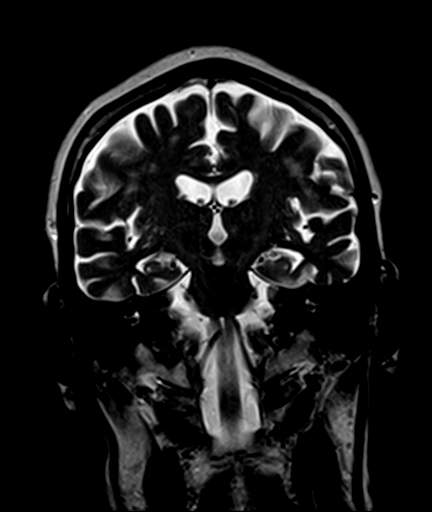
[im 25/25]
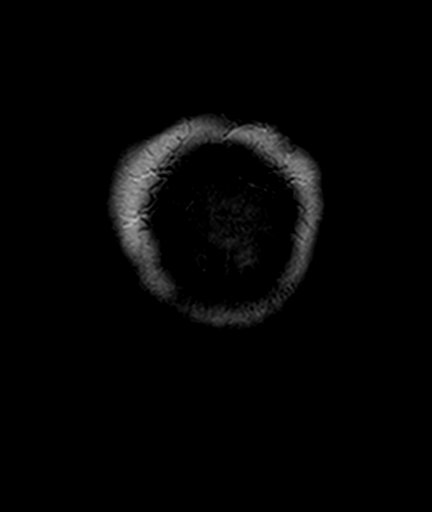

[Series 12: GRE · axial · 5.0mm · 0.45mm/px · z∈[-52,+104]mm · 3 of 25 slices shown (3 of 3)]
[im 1/25]
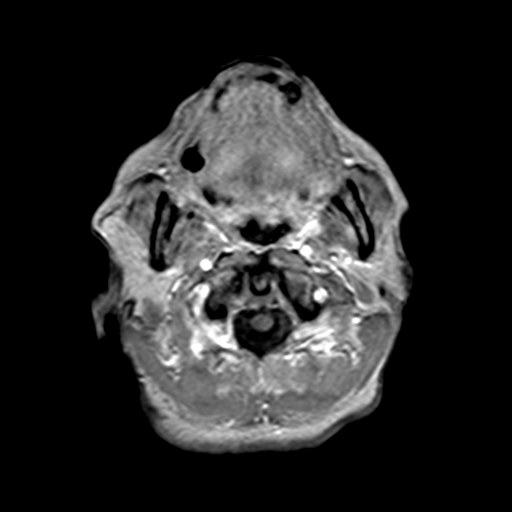
[im 13/25]
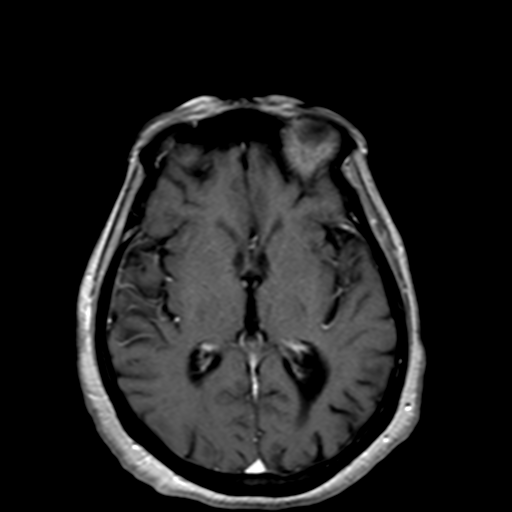
[im 25/25]
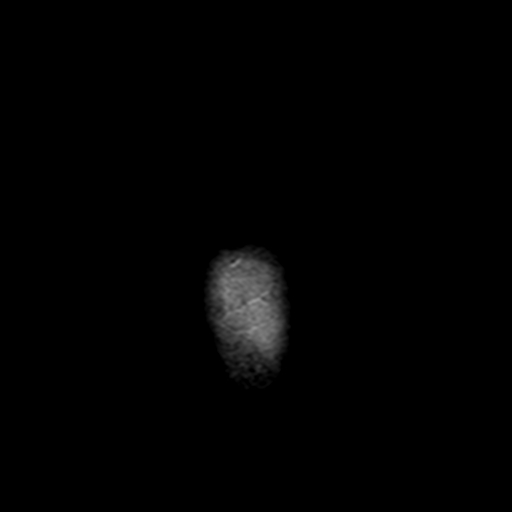

[34 of 48 positions shown; findings below may reference images not displayed]

FINDINGS: Brain: Mild to moderate atrophy. Bilateral hippocampal atrophy is
symmetric. Negative for acute infarct. Moderate chronic
microvascular ischemic change in the white matter. Negative for
hemorrhage or mass. Normal enhancement postcontrast administration

Vascular: Normal arterial flow void

Skull and upper cervical spine: Negative for skull lesion. C1-2
arthropathy with prominent pannus posterior to the dens.

Sinuses/Orbits: Paranasal sinuses clear.  Normal orbit bilaterally.

Other: None
IMPRESSION: Mild-to-moderate generalized atrophy with prominent hippocampal
atrophy bilaterally compatible with dementia. Chronic microvascular
ischemia in the white matter. No acute abnormality.

## 2018-08-29 ENCOUNTER — Other Ambulatory Visit (HOSPITAL_COMMUNITY): Payer: Self-pay | Admitting: Family Medicine

## 2018-09-02 LAB — NOVEL CORONAVIRUS, NAA: SARS-CoV-2, NAA: NOT DETECTED

## 2018-09-05 ENCOUNTER — Other Ambulatory Visit: Payer: Self-pay | Admitting: Family Medicine

## 2018-09-11 LAB — NOVEL CORONAVIRUS, NAA: SARS-CoV-2, NAA: NOT DETECTED

## 2018-11-06 DEATH — deceased
